# Patient Record
Sex: Male | Born: 1970 | Marital: Married | Smoking: Former smoker
Health system: Southern US, Community
[De-identification: ages and names within clinical notes are randomized; demographics above are authoritative.]

## PROBLEM LIST (undated history)

## (undated) DIAGNOSIS — E7800 Pure hypercholesterolemia, unspecified: Secondary | ICD-10-CM

---

## 2008-08-05 ENCOUNTER — Encounter: Payer: Self-pay | Admitting: General Practice

## 2008-08-12 ENCOUNTER — Encounter: Payer: Self-pay | Admitting: General Practice

## 2008-09-11 ENCOUNTER — Encounter: Payer: Self-pay | Admitting: General Practice

## 2011-02-10 ENCOUNTER — Ambulatory Visit: Payer: Self-pay | Admitting: General Practice

## 2011-09-10 IMAGING — US ABDOMEN ULTRASOUND LIMITED
1 series · 17 of 25 positions shown · non-contrast
Comparison: none

REASON FOR EXAM: RUQ abdominal pain radiating to back
COMMENTS:

[Series 1: abdomen ultrasound limited · 17 of 85 slices shown]
[im 1/85]
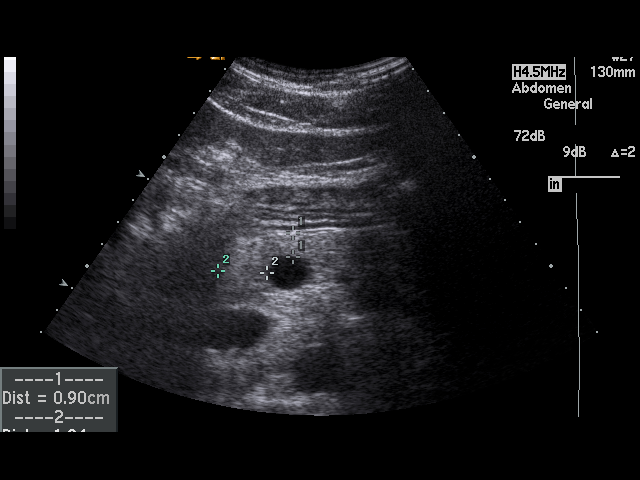
[im 8/85]
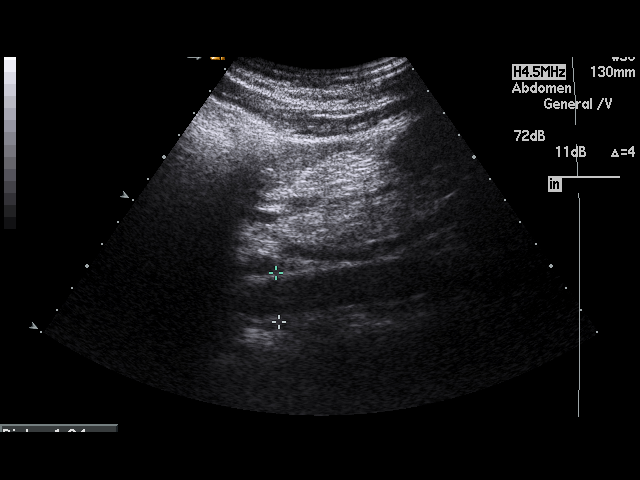
[im 11/85]
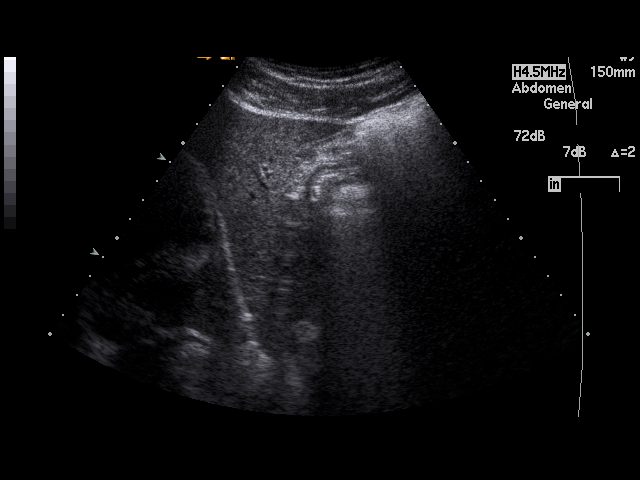
[im 18/85]
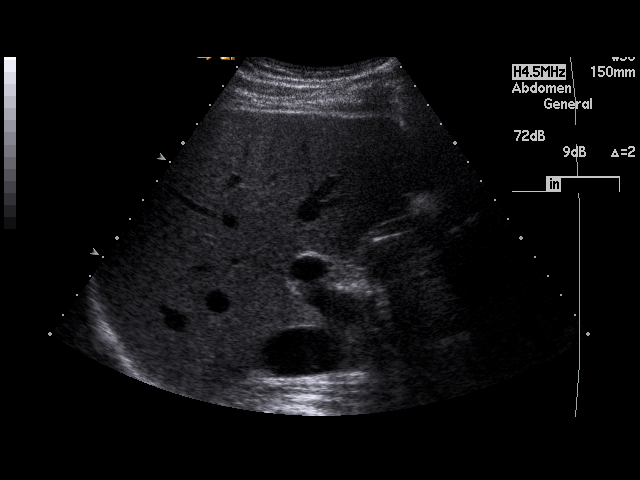
[im 22/85]
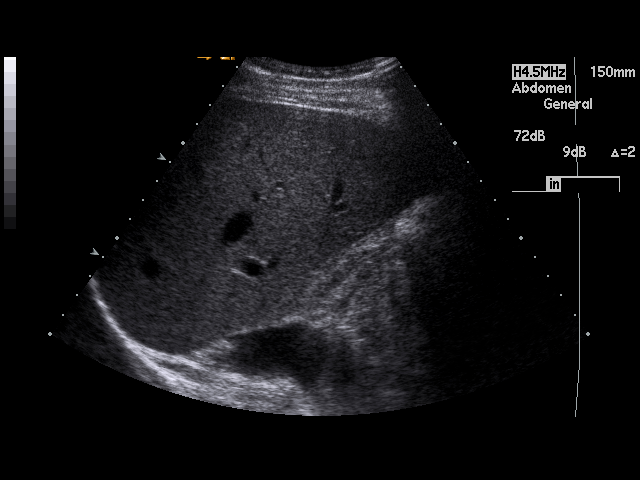
[im 29/85]
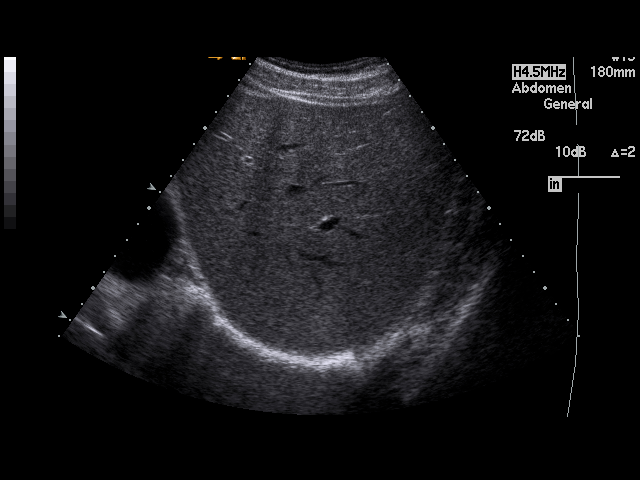
[im 32/85]
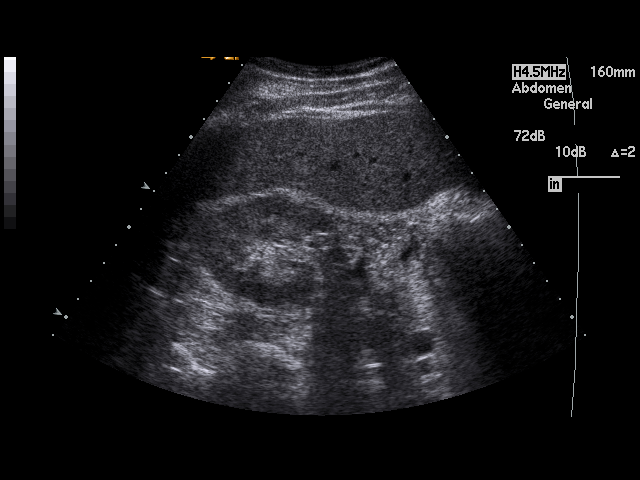
[im 39/85]
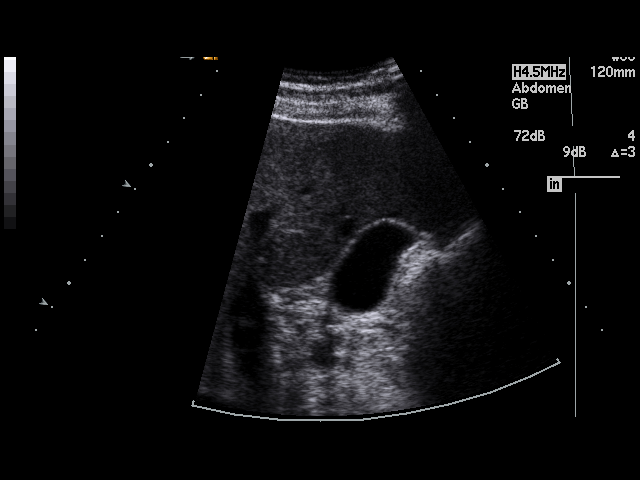
[im 43/85]
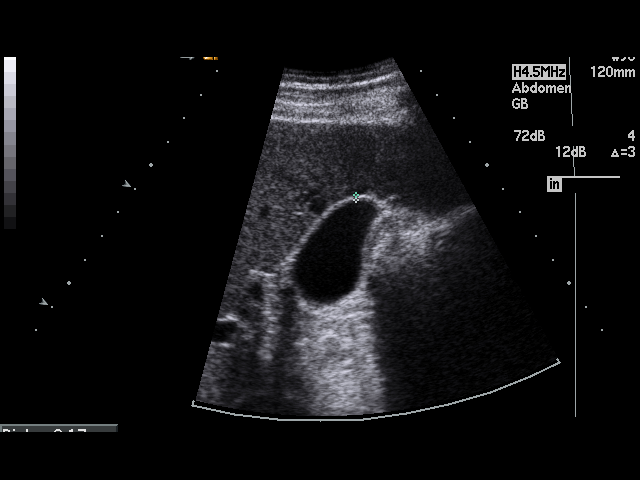
[im 46/85]
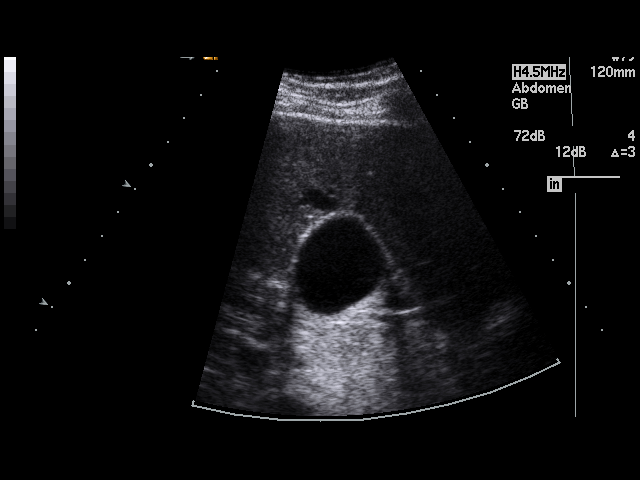
[im 53/85]
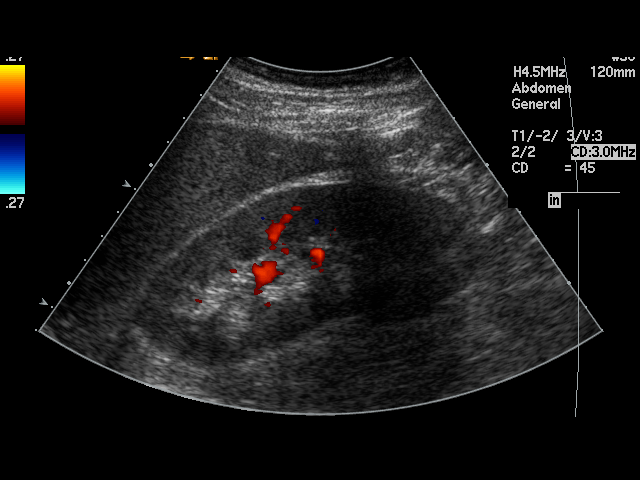
[im 57/85]
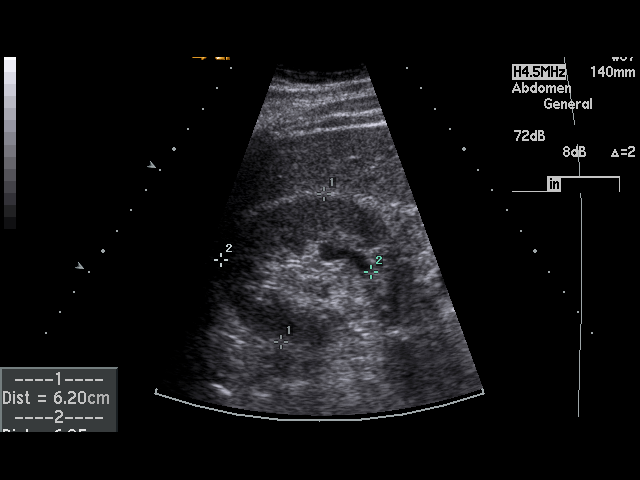
[im 64/85]
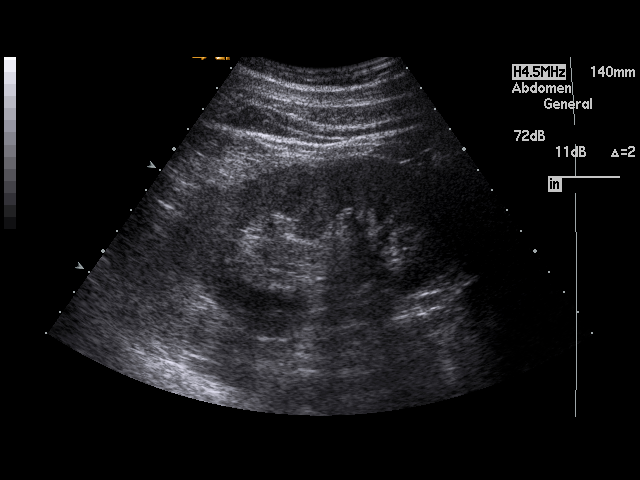
[im 67/85]
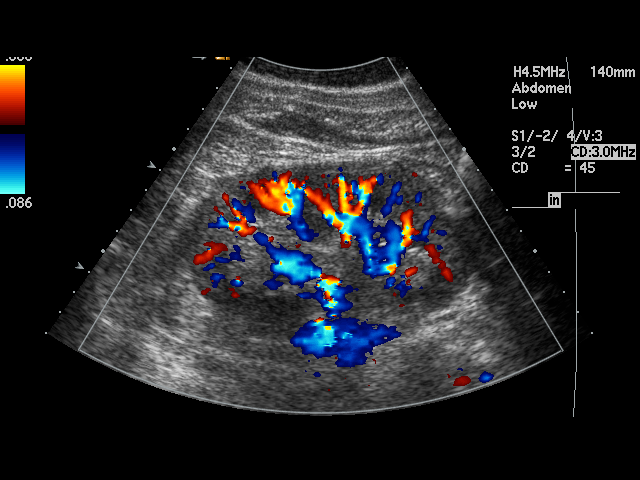
[im 74/85]
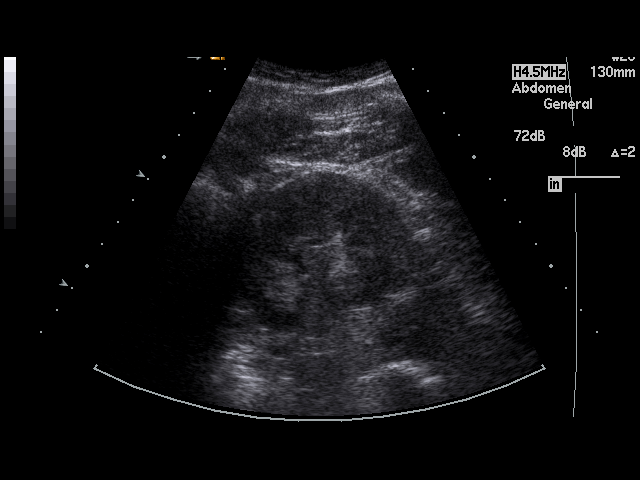
[im 78/85]
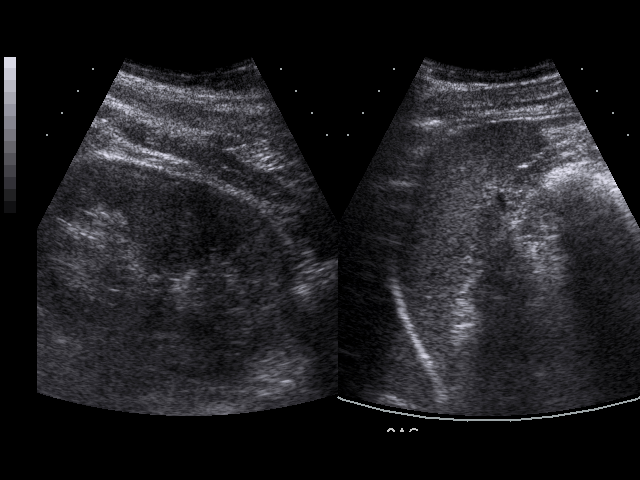
[im 85/85]
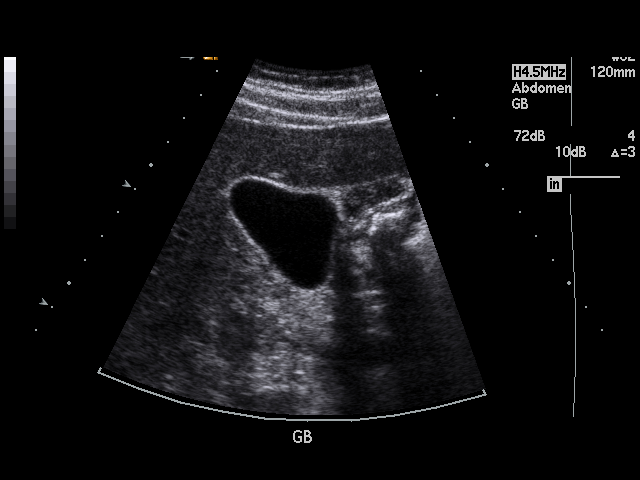

[17 of 25 positions shown; findings below may reference images not displayed]

PROCEDURE:     JOSWEL - JOSWEL ABDOMEN UPPER GENERAL  - February 10, 2011  [DATE]

RESULT:     The liver, spleen, pancreas, abdominal aorta and inferior vena
cava show no significant abnormalities. No gallstones are seen. There is no
thickening of the gallbladder wall. The common bile duct measures 3.5 mm in
diameter which is within normal limits. The kidneys show no hydronephrosis.
Sagittally, the right kidney measures 11.67 cm and the left kidney measures
11.62 cm. No ascites is seen.
IMPRESSION: No significant abnormalities are identified.

## 2015-12-24 ENCOUNTER — Encounter: Payer: Self-pay | Admitting: Physician Assistant

## 2015-12-24 ENCOUNTER — Ambulatory Visit: Payer: Self-pay | Admitting: Physician Assistant

## 2015-12-24 VITALS — BP 120/82 | HR 80 | Temp 98.6°F

## 2015-12-24 DIAGNOSIS — L0291 Cutaneous abscess, unspecified: Secondary | ICD-10-CM

## 2015-12-24 DIAGNOSIS — L0390 Cellulitis, unspecified: Principal | ICD-10-CM

## 2015-12-24 MED ORDER — ERYTHROMYCIN 5 MG/GM OP OINT: 1.0000 "application " | TOPICAL_OINTMENT | Freq: Four times a day (QID) | OPHTHALMIC | Status: DC

## 2015-12-24 MED ORDER — AMOXICILLIN-POT CLAVULANATE 875-125 MG PO TABS: 1.0000 | ORAL_TABLET | Freq: Two times a day (BID) | ORAL | Status: DC

## 2015-12-24 NOTE — Progress Notes (Signed)
S: c/o left upper lid swelling, redness since yesterday, no pain in forehead or other areas of face, no fever/chills  O: vitals wnl, nad, skin on left upper lid with small pustule, lid swollen and red, no drainage, n/v intact  A: cellulitis  P: augmentin 875mg  bid, e-mycin opth ointment

## 2016-03-07 ENCOUNTER — Encounter: Payer: Self-pay | Admitting: Physician Assistant

## 2016-03-07 ENCOUNTER — Ambulatory Visit: Payer: Self-pay | Admitting: Physician Assistant

## 2016-03-07 VITALS — BP 130/80 | HR 76 | Temp 98.4°F

## 2016-03-07 DIAGNOSIS — J069 Acute upper respiratory infection, unspecified: Secondary | ICD-10-CM

## 2016-03-07 DIAGNOSIS — R509 Fever, unspecified: Secondary | ICD-10-CM

## 2016-03-07 MED ORDER — AZITHROMYCIN 250 MG PO TABS: ORAL_TABLET | ORAL | Status: DC

## 2016-03-07 NOTE — Progress Notes (Signed)
S: C/o runny nose and congestion with dry cough for 3 days, + fever, chills, denies cp/sob, v/d; + sore throat, daughter has same sx ; Using otc meds: robitussin  O: PE: vitals wnl, nad,  perrl eomi, normocephalic, tms dull, nasal mucosa red and swollen, throat red, neck supple no lymph, lungs c t a, cv rrr, neuro intact, cough is dry;  flu swab neg  A:  Acute uri  P: zpack,  drink fluids, continue regular meds , use otc meds of choice, return if not improving in 5 days, return earlier if worsening

## 2016-03-09 ENCOUNTER — Telehealth: Payer: Self-pay | Admitting: Physician Assistant

## 2016-03-09 NOTE — Telephone Encounter (Signed)
Contacted patient back Per Darl PikesSusan informed patient that it will take at least 3-4d for medication to take effect. But will give him another excuse note for today.

## 2016-03-09 NOTE — Telephone Encounter (Signed)
Tell him do not want to change his antibiotic yet, will write a work note for him, give the medication more time to work

## 2016-04-05 ENCOUNTER — Other Ambulatory Visit: Payer: Self-pay | Admitting: Physician Assistant

## 2016-06-10 DIAGNOSIS — H5213 Myopia, bilateral: Secondary | ICD-10-CM | POA: Diagnosis not present

## 2016-11-23 ENCOUNTER — Emergency Department: Payer: PRIVATE HEALTH INSURANCE

## 2016-11-23 ENCOUNTER — Emergency Department
Admission: EM | Admit: 2016-11-23 | Discharge: 2016-11-23 | Disposition: A | Discharge status: Home / Self Care | Payer: PRIVATE HEALTH INSURANCE | Attending: Emergency Medicine | Admitting: Emergency Medicine

## 2016-11-23 ENCOUNTER — Encounter: Payer: Self-pay | Admitting: Emergency Medicine

## 2016-11-23 DIAGNOSIS — S39012A Strain of muscle, fascia and tendon of lower back, initial encounter: Secondary | ICD-10-CM | POA: Diagnosis not present

## 2016-11-23 DIAGNOSIS — Y990 Civilian activity done for income or pay: Secondary | ICD-10-CM | POA: Diagnosis not present

## 2016-11-23 DIAGNOSIS — Y929 Unspecified place or not applicable: Secondary | ICD-10-CM | POA: Insufficient documentation

## 2016-11-23 DIAGNOSIS — F17200 Nicotine dependence, unspecified, uncomplicated: Secondary | ICD-10-CM | POA: Diagnosis not present

## 2016-11-23 DIAGNOSIS — R202 Paresthesia of skin: Secondary | ICD-10-CM

## 2016-11-23 DIAGNOSIS — M5136 Other intervertebral disc degeneration, lumbar region: Secondary | ICD-10-CM

## 2016-11-23 DIAGNOSIS — M545 Low back pain, unspecified: Secondary | ICD-10-CM

## 2016-11-23 DIAGNOSIS — M79605 Pain in left leg: Secondary | ICD-10-CM

## 2016-11-23 DIAGNOSIS — X500XXA Overexertion from strenuous movement or load, initial encounter: Secondary | ICD-10-CM | POA: Diagnosis not present

## 2016-11-23 DIAGNOSIS — S3992XA Unspecified injury of lower back, initial encounter: Secondary | ICD-10-CM | POA: Diagnosis present

## 2016-11-23 DIAGNOSIS — N39498 Other specified urinary incontinence: Secondary | ICD-10-CM | POA: Insufficient documentation

## 2016-11-23 DIAGNOSIS — R200 Anesthesia of skin: Secondary | ICD-10-CM

## 2016-11-23 DIAGNOSIS — Y9389 Activity, other specified: Secondary | ICD-10-CM | POA: Diagnosis not present

## 2016-11-23 DIAGNOSIS — R32 Unspecified urinary incontinence: Secondary | ICD-10-CM

## 2016-11-23 DIAGNOSIS — M5126 Other intervertebral disc displacement, lumbar region: Secondary | ICD-10-CM

## 2016-11-23 LAB — BASIC METABOLIC PANEL
Anion gap: 4 — ABNORMAL LOW (ref 5–15)
BUN: 12 mg/dL (ref 6–20)
CO2: 26 mmol/L (ref 22–32)
Calcium: 8.7 mg/dL — ABNORMAL LOW (ref 8.9–10.3)
Chloride: 107 mmol/L (ref 101–111)
Creatinine, Ser: 0.69 mg/dL (ref 0.61–1.24)
GFR calc Af Amer: >60 mL/min (ref >60)
GFR calc non Af Amer: >60 mL/min (ref >60)
Glucose, Bld: 112 mg/dL — ABNORMAL HIGH (ref 65–99)
Potassium: 3.8 mmol/L (ref 3.5–5.1)
Sodium: 137 mmol/L (ref 135–145)

## 2016-11-23 LAB — CBC WITH DIFFERENTIAL/PLATELET
Basophils Absolute: 0.1 10*3/uL (ref 0–0.1)
Basophils Relative: 1 %
Eosinophils Absolute: 0.5 10*3/uL (ref 0–0.7)
Eosinophils Relative: 7 %
HCT: 45.2 % (ref 40.0–52.0)
Hemoglobin: 15.9 g/dL (ref 13.0–18.0)
Lymphocytes Relative: 24 %
Lymphs Abs: 1.9 10*3/uL (ref 1.0–3.6)
MCH: 29.8 pg (ref 26.0–34.0)
MCHC: 35.2 g/dL (ref 32.0–36.0)
MCV: 84.8 fL (ref 80.0–100.0)
Monocytes Absolute: 0.5 10*3/uL (ref 0.2–1.0)
Monocytes Relative: 7 %
Neutro Abs: 4.8 10*3/uL (ref 1.4–6.5)
Neutrophils Relative %: 61 %
Platelets: 167 10*3/uL (ref 150–440)
RBC: 5.33 MIL/uL (ref 4.40–5.90)
RDW: 13.9 % (ref 11.5–14.5)
WBC: 7.7 10*3/uL (ref 3.8–10.6)

## 2016-11-23 MED ORDER — HYDROMORPHONE HCL 1 MG/ML IJ SOLN
INTRAMUSCULAR | Status: AC
  Administered 2016-11-23: 1 mg via INTRAVENOUS
  Filled 2016-11-23: qty 1

## 2016-11-23 MED ORDER — HYDROMORPHONE HCL 1 MG/ML IJ SOLN
1.0000 mg | Freq: Once | INTRAMUSCULAR | Status: AC
  Administered 2016-11-23: 1 mg via INTRAVENOUS

## 2016-11-23 MED ORDER — OXYCODONE-ACETAMINOPHEN 5-325 MG PO TABS: 1.0000 | ORAL_TABLET | Freq: Four times a day (QID) | ORAL | 0 refills | Status: AC | PRN

## 2016-11-23 MED ORDER — CYCLOBENZAPRINE HCL 10 MG PO TABS: 10.0000 mg | ORAL_TABLET | Freq: Three times a day (TID) | ORAL | 0 refills | Status: DC | PRN

## 2016-11-23 MED ORDER — PREDNISONE 10 MG PO TABS: ORAL_TABLET | ORAL | 0 refills | Status: DC

## 2016-11-23 MED ORDER — METHYLPREDNISOLONE SODIUM SUCC 125 MG IJ SOLR
80.0000 mg | Freq: Once | INTRAMUSCULAR | Status: AC
  Administered 2016-11-23: 80 mg via INTRAVENOUS
  Filled 2016-11-23: qty 2

## 2016-11-23 MED ORDER — HYDROMORPHONE HCL 1 MG/ML IJ SOLN
1.0000 mg | Freq: Once | INTRAMUSCULAR | Status: AC
  Administered 2016-11-23: 1 mg via INTRAVENOUS
  Filled 2016-11-23: qty 1

## 2016-11-23 NOTE — Discharge Instructions (Signed)
Please call Dr. Lucienne CapersYarbrough's office to schedule follow up

## 2016-11-23 NOTE — ED Provider Notes (Signed)
[facility] Emergency Department Provider Note  ____________________________________________  Time seen: Approximately 11:25 AM  I have reviewed the triage vital signs and the nursing notes.   HISTORY  Chief Complaint Back Pain    HPI Darin Murphy is a 45 y.o. male , NAD, presents to emergency department via EMS for evaluation of lower back pain. Patient states he was lifting 100 pound boxes while at work earlier today. Had 4 boxes to lift and move. States by the time he got to the third box he felt a pop and had severe pain in his lower back. This caused him to fall forward but denies head injury, neck pain. States that since the injury is had severe lower back pain that is worse on the left. Has numbness and tingling down the left lower extremity. Also notes that he urinated a small amount at the time of the injury but has had control of his bowel and bladder since that time. Denies any numbness or tingling in the saddle region. Has not noted any open wounds or lacerations. Denies any history of back pain or surgeries. Has had no loss of consciousness, lightheadedness or dizziness. Patient does note that his pain is 10 out of 10. Was given fentanyl via IV while being transported by EMS.   History reviewed. No pertinent past medical history.  There are no active problems to display for this patient.   History reviewed. No pertinent surgical history.  Prior to Admission medications   Medication Sig Start Date End Date Taking? Authorizing Provider  cyclobenzaprine (FLEXERIL) 10 MG tablet Take 1 tablet (10 mg total) by mouth 3 (three) times daily as needed for muscle spasms. 11/23/16   Karrah Mangini L Avabella Wailes, PA-C  esomeprazole (NEXIUM) 40 MG capsule TAKE 1 CAPSULE BY MOUTH ONCE DAILY 04/06/16   Faythe GheeSusan W Fisher, PA-C  oxyCODONE-acetaminophen (ROXICET) 5-325 MG tablet Take 1 tablet by mouth every 6 (six) hours as needed. 11/23/16 11/23/17  Kaisyn Reinhold L Savanah Bayles, PA-C  predniSONE  (DELTASONE) 10 MG tablet Take a daily regimen of 6,5,4,3,2,1 11/23/16   Ronny Ruddell L Alston Berrie, PA-C    Allergies Patient has no known allergies.  History reviewed. No pertinent family history.  Social History Social History  Substance Use Topics  . Smoking status: Current Every Day Smoker  . Smokeless tobacco: Never Used  . Alcohol use No     Review of Systems  Constitutional: No fever/chills Eyes: No visual changes.  Cardiovascular: No chest pain. Respiratory: No shortness of breath. No wheezing.  Gastrointestinal: No abdominal pain.  No nausea, vomiting.  No diarrhea.  No constipation. Genitourinary: Negative for dysuria, hematuria. No urinary hesitancy, urgency or increased frequency. Musculoskeletal: Positive for lower back pain. No neck pain. Skin: Negative for rash, Redness, swelling, bruising. Neurological: Positive loss of bladder control during the incident but none since. Positive numbness and tingling left lower extremity. No saddle paresthesias. No head injury, LOC, dizziness, lightheadedness. 10-point ROS otherwise negative.  ____________________________________________   PHYSICAL EXAM:  VITAL SIGNS: ED Triage Vitals  Enc Vitals Group     BP 11/23/16 1116 132/67     Pulse Rate 11/23/16 1116 100     Resp 11/23/16 1116 18     Temp 11/23/16 1116 97.7 F (36.5 C)     Temp Source 11/23/16 1116 Oral     SpO2 11/23/16 1116 96 %     Weight 11/23/16 1117 178 lb (80.7 kg)     Height 11/23/16 1117 5\' 2"  (1.575 m)  Head Circumference --      Peak Flow --      Pain Score 11/23/16 1117 9     Pain Loc --      Pain Edu? --      Excl. in GC? --      Constitutional: Alert and oriented. Well appearing and in no acute distress, but in pain. Eyes: Conjunctivae are normal without icterus, injection or hemorrhage  Head: Atraumatic. Neck: Supple with FROM. Hematological/Lymphatic/Immunilogical: No cervical lymphadenopathy. Cardiovascular: Normal rate, regular rhythm.  Normal S1 and S2.  Good peripheral circulation. Respiratory: Normal respiratory effort without tachypnea or retractions. Lungs CTAB With breath sounds heard in all lung fields. No wheeze, rhonchi, rales. Musculoskeletal: Decreased range of motion of bilateral hips due to lower back pain. Patient is able to passively move bilateral lower extremities but minimally due to pain. Once patient's pain was better controlled, he had better movement of the lower extremities. No lower extremity tenderness nor edema.  No joint effusions. Neurologic:  Normal speech and language. No gross focal neurologic deficits are appreciated. Decreased sensation to light touch as well as sharp about the medial left lower extremity. Skin:  Skin is warm, dry and intact. No rash noted. Psychiatric: Mood and affect are normal. Speech and behavior are normal. Patient exhibits appropriate insight and judgement.   ____________________________________________   LABS (all labs ordered are listed, but only abnormal results are displayed)  Labs Reviewed  BASIC METABOLIC PANEL - Abnormal; Notable for the following:       Result Value   Glucose, Bld 112 (*)    Calcium 8.7 (*)    Anion gap 4 (*)    All other components within normal limits  CBC WITH DIFFERENTIAL/PLATELET   ____________________________________________  EKG  None ____________________________________________  RADIOLOGY I, Ernestene KielJami L Teruko Joswick, personally viewed and evaluated these images (plain radiographs) as part of my medical decision making, as well as reviewing the written report by the radiologist.  Mr Lumbar Spine Wo Contrast  Result Date: 11/23/2016 CLINICAL DATA:  Initial evaluation for low back pain with left leg numbness, loss of bladder control. EXAM: MRI LUMBAR SPINE WITHOUT CONTRAST TECHNIQUE: Multiplanar, multisequence MR imaging of the lumbar spine was performed. No intravenous contrast was administered. COMPARISON:  None available. FINDINGS:  Segmentation: Normal segmentation. Lowest well-formed disc is labeled the L5-S1 level. Alignment: Vertebral bodies are normally aligned with preservation of the normal lumbar lordosis. No listhesis. Vertebrae: Vertebral body heights are well preserved. No evidence for acute or chronic fracture. Signal intensity within the vertebral body bone marrow is normal. No focal osseous lesions. No abnormal marrow edema. Conus medullaris: Extends to the L1 Level and appears normal. Paraspinal and other soft tissues: Paraspinous soft tissues within normal limits. Tiny T2 hyperintense simple cyst noted within the left kidney. Visualized visceral structures are otherwise unremarkable. Disc levels: L1-2:  Negative. L2-3:  Negative. L3-4:  Negative. L4-5: Mild degenerative disc bulging. Superimposed shallow right foraminal disc protrusion, closely approximating the exiting right L4 nerve root without neural impingement (series 5, image 27). Central canal widely patent. No significant foraminal encroachment. L5-S1: Broad posterior disc bulge, eccentric to the left, mildly encroaches upon the left lateral recess (series 5, image 32). Central canal remains widely patent. Minimal foraminal narrowing bilaterally. Protruding disc closely approximates the transiting left S1 nerve root without frank neural impingement. Central canal remains widely patent. Minimal left foraminal narrowing. IMPRESSION: 1. Broad left eccentric disc bulge at L5-S1, closely approximating the transiting left S1 nerve root  without neural impingement or stenosis. 2. Shallow right foraminal disc protrusion at L4-5, closely approximating the transiting right L4 nerve root without impingement or stenosis. 3. Otherwise normal MRI of the lumbar spine. No significant canal stenosis. Electronically Signed   By: Rise Mu M.D.   On: 11/23/2016 13:08    ____________________________________________    PROCEDURES  Procedure(s) performed:  None   Procedures   Medications  HYDROmorphone (DILAUDID) injection 1 mg (1 mg Intravenous Given 11/23/16 1204)  methylPREDNISolone sodium succinate (SOLU-MEDROL) 125 mg/2 mL injection 80 mg (80 mg Intravenous Given 11/23/16 1410)  HYDROmorphone (DILAUDID) injection 1 mg (1 mg Intravenous Given 11/23/16 1410)     ____________________________________________   INITIAL IMPRESSION / ASSESSMENT AND PLAN / ED COURSE  Pertinent labs & imaging results that were available during my care of the patient were reviewed by me and considered in my medical decision making (see chart for details).  Clinical Course     Patient's diagnosis is consistent with Bulging lumbar disks, lower back strain and sciatica of the left side. Patient's MRI showed disc bulge at L4-L5 and L5-S1. The L5-S1 bulge was approximating to the left without neural impingement or stenosis. No impingement or stenosis was noted at L4-L5. Patient was given Dilaudid and Solu-Medrol while in the emergency department and tolerated well with significant decrease in pain. Patient had no further episodes of loss of bladder control and had no episodes of loss of bowel control. Patient continues to deny any numbness or tingling in the saddle region. He has had increased range of motion of the left lower extremity since being given pain medication but still notes some tingling. Patient will be discharged home with prescriptions for Roxicet, prednisone Dosepak and Flexeril to take as directed. Patient is to follow up with Dr. Marcell Barlow in neurosurgery for further evaluation and treatment. Patient is given ED precautions to return to the ED for any worsening or new symptoms.    ____________________________________________  FINAL CLINICAL IMPRESSION(S) / ED DIAGNOSES  Final diagnoses:  Loss of bladder control  Numbness and tingling of left leg  Low back pain radiating to left lower extremity  Bulging lumbar disc  Strain of lumbar region,  initial encounter      NEW MEDICATIONS STARTED DURING THIS VISIT:  Discharge Medication List as of 11/23/2016  1:52 PM    START taking these medications   Details  cyclobenzaprine (FLEXERIL) 10 MG tablet Take 1 tablet (10 mg total) by mouth 3 (three) times daily as needed for muscle spasms., Starting Wed 11/23/2016, Print    oxyCODONE-acetaminophen (ROXICET) 5-325 MG tablet Take 1 tablet by mouth every 6 (six) hours as needed., Starting Wed 11/23/2016, Until Thu 11/23/2017, Print    predniSONE (DELTASONE) 10 MG tablet Take a daily regimen of 6,5,4,3,2,1, Print             Hope Pigeon, PA-C 11/23/16 1522    Jeanmarie Plant, MD 11/23/16 1538

## 2016-11-23 NOTE — ED Triage Notes (Signed)
Pt presents to ED via EMS from work c/o back pain/injury related to work. Picked up heavy boxes and heard something "pop"

## 2016-12-24 ENCOUNTER — Encounter: Payer: Self-pay | Admitting: Emergency Medicine

## 2016-12-24 ENCOUNTER — Emergency Department
Admission: EM | Admit: 2016-12-24 | Discharge: 2016-12-24 | Disposition: A | Discharge status: Home / Self Care | Payer: PRIVATE HEALTH INSURANCE | Attending: Emergency Medicine | Admitting: Emergency Medicine

## 2016-12-24 DIAGNOSIS — X58XXXD Exposure to other specified factors, subsequent encounter: Secondary | ICD-10-CM | POA: Insufficient documentation

## 2016-12-24 DIAGNOSIS — F17200 Nicotine dependence, unspecified, uncomplicated: Secondary | ICD-10-CM | POA: Insufficient documentation

## 2016-12-24 DIAGNOSIS — M545 Low back pain: Secondary | ICD-10-CM | POA: Diagnosis present

## 2016-12-24 DIAGNOSIS — M5136 Other intervertebral disc degeneration, lumbar region: Secondary | ICD-10-CM | POA: Diagnosis not present

## 2016-12-24 DIAGNOSIS — Z79899 Other long term (current) drug therapy: Secondary | ICD-10-CM | POA: Diagnosis not present

## 2016-12-24 DIAGNOSIS — S39012D Strain of muscle, fascia and tendon of lower back, subsequent encounter: Secondary | ICD-10-CM | POA: Diagnosis not present

## 2016-12-24 MED ORDER — KETOROLAC TROMETHAMINE 60 MG/2ML IM SOLN
60.0000 mg | Freq: Once | INTRAMUSCULAR | Status: AC
  Administered 2016-12-24: 60 mg via INTRAMUSCULAR
  Filled 2016-12-24: qty 2

## 2016-12-24 MED ORDER — CYCLOBENZAPRINE HCL 10 MG PO TABS: 10.0000 mg | ORAL_TABLET | Freq: Three times a day (TID) | ORAL | 1 refills | Status: DC | PRN

## 2016-12-24 MED ORDER — HYDROCODONE-ACETAMINOPHEN 5-325 MG PO TABS: 1.0000 | ORAL_TABLET | Freq: Four times a day (QID) | ORAL | 0 refills | Status: AC | PRN

## 2016-12-24 MED ORDER — KETOROLAC TROMETHAMINE 10 MG PO TABS: 10.0000 mg | ORAL_TABLET | Freq: Three times a day (TID) | ORAL | 0 refills | Status: DC

## 2016-12-24 MED ORDER — CYCLOBENZAPRINE HCL 10 MG PO TABS
10.0000 mg | ORAL_TABLET | Freq: Once | ORAL | Status: AC
  Administered 2016-12-24: 10 mg via ORAL
  Filled 2016-12-24: qty 1

## 2016-12-24 MED ORDER — NABUMETONE 750 MG PO TABS: 750.0000 mg | ORAL_TABLET | Freq: Two times a day (BID) | ORAL | 0 refills | Status: DC

## 2016-12-24 NOTE — ED Triage Notes (Signed)
Pt to ED c/o back pain. Per pt family member, pt injured self over a month ago at work and was dx with herniated disc. But back pain became worse last night radiating bilaterally down legs.Pt also reports lost of bowels and bladder. In no acute distress.

## 2016-12-24 NOTE — Discharge Instructions (Signed)
Take the prescription meds as directed. Follow-up with the specialist as scheduled.

## 2016-12-24 NOTE — ED Provider Notes (Signed)
[facility] Emergency Department Provider Note ____________________________________________  Time seen: 1119  I have reviewed the triage vital signs and the nursing notes.  HISTORY  Chief Complaint  Back Pain  HPI Darin Murphy is a 46 y.o. male presents to the ED for evaluation management of ongoing back pain following a work-related injury 1 month ago. Patient was evaluated here in the ED on the date of injury (11/23/16), and evaluated with an MRI which showed some mild disc bulge without nerve impingement at L4-5 and L5-S1. Patient was discharged with prescriptions for pain medicine, muscle relaxant,and steroid. He reports dose of the medications as prescribed in the interim. He does note that the side effect of the oxycodone was heavy sedation and some hallucinations. 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 Patient at this time reports continued low back pain with some referral into the upper hips bilaterally. He denies any distal paresthesias, foot drop, or incontinence of bladder and bowel. He does note some increased frequency in urination, but further notes that that symptom has been persistent prior to his injury. He denies any interim injury, and has not been returned to work since the date of accident.  History reviewed. No pertinent past medical history.  There are no active problems to display for this patient.   History reviewed. No pertinent surgical history.  Prior to Admission medications   Medication Sig Start Date End Date Taking? Authorizing Provider  cyclobenzaprine (FLEXERIL) 10 MG tablet Take 1 tablet (10 mg total) by mouth 3 (three) times daily as needed for muscle spasms. 11/23/16   Jami L Hagler, PA-C  cyclobenzaprine (FLEXERIL) 10 MG tablet Take 1 tablet (10 mg total) by mouth 3 (three) times daily as needed for muscle spasms. 12/24/16   Yedidya Duddy V Bacon Aleksa Collinsworth,  PA-C  esomeprazole (NEXIUM) 40 MG capsule TAKE 1 CAPSULE BY MOUTH ONCE DAILY 04/06/16   Faythe GheeSusan W Fisher, PA-C  HYDROcodone-acetaminophen (NORCO) 5-325 MG tablet Take 1 tablet by mouth every 6 (six) hours as needed. 12/24/16 12/31/16  Sigmund Morera V Bacon Vinessa Macconnell, PA-C  ketorolac (TORADOL) 10 MG tablet Take 1 tablet (10 mg total) by mouth every 8 (eight) hours. 12/24/16   Theta Leaf V Bacon Tytianna Greenley, PA-C  nabumetone (RELAFEN) 750 MG tablet Take 1 tablet (750 mg total) by mouth 2 (two) times daily. 12/24/16   Jhoanna Heyde V Bacon Criag Wicklund, PA-C  oxyCODONE-acetaminophen (ROXICET) 5-325 MG tablet Take 1 tablet by mouth every 6 (six) hours as needed. 11/23/16 11/23/17  Jami L Hagler, PA-C  predniSONE (DELTASONE) 10 MG tablet Take a daily regimen of 6,5,4,3,2,1 11/23/16   Jami L Hagler, PA-C    Allergies Patient has no known allergies.  History reviewed. No pertinent family history.  Social History Social History  Substance Use Topics  . Smoking status: Current Every Day Smoker  . Smokeless tobacco: Never Used  . Alcohol use No    Review of Systems  Constitutional: Negative for fever. Cardiovascular: Negative for chest pain. Respiratory: Negative for shortness of breath. Gastrointestinal: Negative for abdominal pain, vomiting and diarrhea. Genitourinary: Negative for dysuria. Musculoskeletal: Positive for back pain. Skin: Negative for rash. Neurological: Negative for headaches, focal weakness or numbness. Denies incontinence of bladder or bowel.  ____________________________________________  PHYSICAL EXAM:  VITAL SIGNS: ED Triage Vitals  Enc Vitals Group     BP      Pulse      Resp  Temp      Temp src      SpO2      Weight      Height      Head Circumference      Peak Flow      Pain Score      Pain Loc      Pain Edu?      Excl. in GC?     Constitutional: Alert and oriented. Well appearing and in no distress. Head: Normocephalic and atraumatic. Cardiovascular: Normal rate, regular  rhythm. Normal distal pulses. Respiratory: Normal respiratory effort. No wheezes/rales/rhonchi. Gastrointestinal: Soft and nontender. No distention. Musculoskeletal: Normal spinal alignment without midline tenderness, spasm, deformity, or step-off. Patient localizes pain to the bilateral paraspinal musculature of the lumbar sacral junction. He also has some tenderness over the SI joint on the right greater than left. Patient with a slow transition from sit to stand on exam. He said that demonstrated a normal toe and heel raise. He also has normal lumbar flexion to the knees and lumbar extension is noted. Negative Trendelenburg noted. Nontender with normal range of motion in all extremities.  Neurologic: Patient with normal LE DTRs bilaterally. Normal toe dorsiflexion and foot eversion noted bilaterally. Mildly antalgic gait without ataxia. Negative supine and seated straight leg raise bilaterally. Normal speech and language. No gross focal neurologic deficits are appreciated. ____________________________________________   RADIOLOGY  MRI Lumbar Spine (11/23/2016)  IMPRESSION:  1. Broad left eccentric disc bulge at L5-S1, closely approximating the transiting left S1 nerve root without neural impingement or stenosis.  2. Shallow right foraminal disc protrusion at L4-5, closely approximating the transiting right L4 nerve root without impingement or stenosis.  3. Otherwise normal MRI of the lumbar spine. No significant canal stenosis.  Electronically Signed   By: Rise Mu M.D.   On: 11/23/2016 13:08  ____________________________________________  PROCEDURES  Toradol 60 mg IM Flexeril 10 mg PO ____________________________________________  INITIAL IMPRESSION / ASSESSMENT AND PLAN / ED COURSE  Patient with symptoms improved at the time of discharge after IM and by mouth medication administration. He is discharged with prescriptions for Norco 5/325, Flexeril 10 mg, and Toradol 10 mg. He  was also given a prescription for Relafen 750 mg to dose after the completion of his Toradol course. He will follow up with neurosurgery as scheduled in 2 weeks. Patient will follow-up with [place] [facility] for interim medical care. He is released at this time with previous restrictions as set forth by employee health.  Clinical Course    ____________________________________________  FINAL CLINICAL IMPRESSION(S) / ED DIAGNOSES  Final diagnoses:  Strain of lumbar region, subsequent encounter  Degenerative disc disease, lumbar      Lissa Hoard, PA-C 12/24/16 1403    Governor Rooks, MD 12/24/16 [phone]

## 2017-01-05 ENCOUNTER — Ambulatory Visit: Payer: PRIVATE HEALTH INSURANCE | Attending: Neurosurgery

## 2017-01-05 ENCOUNTER — Encounter: Payer: Self-pay | Admitting: Physical Therapy

## 2017-01-05 DIAGNOSIS — M5442 Lumbago with sciatica, left side: Secondary | ICD-10-CM | POA: Insufficient documentation

## 2017-01-05 DIAGNOSIS — R2689 Other abnormalities of gait and mobility: Secondary | ICD-10-CM | POA: Insufficient documentation

## 2017-01-05 NOTE — Patient Instructions (Signed)
Extension: Lumbar - Prone    1. Start by laying on stomach 5 minutes every hour during the daytime, if needed place 1-2 pillows under stomach until you progress to laying flat on stomach.  2. Once you can lay flat on stomach you can progress to propped up on elbows for 5 minutes every hour during the daytime.  3. Final progression would performing press up like the picture above (keep pelvis on the floor and arch lower back). Only perform this once you can tolerate propped on elbows for 5 minutes every hour. Perform 3 sets of 10 press-ups every hour during the day time.  Avoid activities where you are bending forward. Make sure you have good lumbar support when sitting and if needed use a towel roll under low back. Use heat and prescribed medications to help with muscle spasms.

## 2017-01-06 NOTE — Therapy (Signed)
[facility]INE [address] [place], [address] Phone: [phone]   Fax:  [phone]  Physical Therapy Evaluation  Patient Details  Name: Darin Murphy MRN: [id number] Date of Birth: 09/19/1971 Referring Provider: Tressie Stalker  Encounter Date: 01/05/2017      PT End of Session - 01/06/17 0930    Visit Number 1   Number of Visits 16   Date for PT Re-Evaluation 03/03/17   Authorization Type work comp auth 16 visits   PT Start Time 1350   PT Stop Time 1450   PT Time Calculation (min) 60 min   Activity Tolerance Patient limited by pain   Behavior During Therapy [name] for tasks assessed/performed      History reviewed. No pertinent past medical history.  History reviewed. No pertinent surgical history.  There were no vitals filed for this visit.       Subjective Assessment - 01/05/17 1407    Subjective Low back pain   Patient is accompained by: Family member  Initially young male present with patient but she leaves before pt retrieved from waiting room   Pertinent History Quanta Id is a 46 y.o.male who was lifting 100 pound boxes while at work on 11/23/16. He had 4 boxes to lift and move and reports that by the time he got to the third box he felt a pop and had severe pain in his lower back. This caused him to fall forward but he denies head injury, neck pain. States that since the injury is had severe lower back pain that is worse on the left. Has pain that intermittently runs down to the L foot. Also notes that he urinated a small amount at the time of the injury but has had control of his bowel and bladder since that time. He does report increased frequency of urination since the injury and plans to make an appointment with a doctor to discuss. Denies saddle paresthesia. Pt reports he had a similar injury 6-8 years ago but states that it improved with medication. He had a few bouts of physical therapy at  that time but states that it did not help. He denies any history of back surgeries. MRI showed broad left eccentric disc bulge at L5-S1, closely approximating the transiting left S1 nerve root without neural impingement or stenosis and shallow right foraminal disc protrusion at L4-5, closely approximating the transiting right L4 nerve root without impingement or stenosis. Pt saw neurosurgery who recommended PT as well as spinal injection. Pt has an appointment for the injection scheduled toward the end of February but he cannot remember the exact date. Of note there appears to be somewhat of a language barrier present, as pt does not appear to understand all of the questions that are asked by therapist. ROS negative for red flags   How long can you sit comfortably? 15 minutes   How long can you stand comfortably? 1 hr   How long can you walk comfortably? 5 minutes   Diagnostic tests MRI: broad left eccentric disc bulge at L5-S1, closely approximating the transiting left S1 nerve root without neural impingement or stenosis and shallow right foraminal disc protrusion at L4-5, closely approximating the transiting right L4 nerve root without impingement or stenosis   Patient Stated Goals Decrease pain   Currently in Pain? Yes   Pain Score 8   Best: 4/10; Worst: 9/10   Pain Location Back   Pain Orientation Lower  Central low  back   Pain Descriptors / Indicators Sharp   Pain Type Acute pain   Pain Radiating Towards Posterior LLE down to L foot   Pain Onset More than a month ago   Pain Frequency Constant   Aggravating Factors  wakes him out of his sleep, pain is worse at night when laying down, remaining in one position for too long, forward bending, sitting at a computer   Pain Relieving Factors Medications but otherwise unable to describe. states he has not tried ice/heat.    Multiple Pain Sites No            [facility] PT Assessment - 01/05/17 1413      Assessment   Medical Diagnosis Strain of  lumbar region   Referring Provider Tressie Stalker   Onset Date/Surgical Date 11/23/16   Hand Dominance Right   Next MD Visit Late February with neurosurgeon   Prior Therapy Yes when he had injury 6-8 years ago. States that it did not help     Precautions   Precautions None     Restrictions   Weight Bearing Restrictions No     Balance Screen   Has the patient fallen in the past 6 months Yes   How many times? 1   Has the patient had a decrease in activity level because of a fear of falling?  Yes   Is the patient reluctant to leave their home because of a fear of falling?  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 Otherwise intact sensation to light touch in bilateral LEs L2-S2. Denies saddle paresthesia     Posture/Postural Control   Posture Comments Pt leaning forward secondary to pain. Difficult to assess at this time      ROM / Strength   AROM / PROM / Strength AROM;Strength     AROM   Overall AROM Comments Pt reports pain with lumbar flexion, extension, rotation, and lateral flexion. Unable to report any peripheralization or centralization of symptoms with attempt at repeated movements. Does not tolerate well. Attempted CPA to lumbar spine but pt will not tolerate. Attempted passive hip flexion but pt does not tolerate. HIp IR/ER appears grossly WFL without reproduction of pain. Slump negative. SLR positive bilaterally 45 degrees RLE and 55 degrees LLE. Attempted prone positioning with patient but he  doesn't tolerate. Placed 1 pillow under hips and appears to make slightly better. Attempted to place moist hot pack on low back but pt only tolerates for approximatel 30 seconds before reporting that the weight of the hotpack increases his pain too much.     Strength   Overall Strength Comments Pt gives poor effort during strength testing. Reports increase in low back pain with all attempts at manual muscles testing of bilateral LEs including ankle dorsiflexion. Pain response appears to be somewhat exaggerated. No clonus or changes in tone noted. Pt unable to perform hooklying bridges secondary to pain     Palpation  Palpation comment Pt only tolerates very light palpation of lumbar paraspinals. No overt spasms noted but palpation is extremely limited     Transfers   Comments Independent with transfers without UE support     Ambulation/Gait   Gait Comments Gait is antalgic with forward flexed trunk           Attempted prone positioning with pillow under hips but pt only able to tolerate for 3-4 minutes due to pain. Attempted moist heat pack on back but pt reports it is painful due to pressure. Pt educated in HEP which including prone positioning progression.                 PT Education - 01/06/17 0929    Education provided Yes   Education Details HEP, plan of care   Person(s) Educated Patient   Methods Explanation   Comprehension Verbalized understanding             PT Long Term Goals - 01/06/17 1030      PT LONG TERM GOAL #1   Title Pt will be independent with HEP in order to improve strength and balance in order to decrease fall risk and improve function at home and work.    Time 8   Period Weeks   Status New     PT LONG TERM GOAL #2   Title  Pt will decrease mODI scoreby at least 13 points in order demonstrate clinically significant reduction in pain/disability    Baseline 01/05/17: 78%   Time 8   Status New     PT LONG TERM GOAL #3   Title Pt will  decrease low back pain to at worst 7/10 on NPRS in order to demonstrate clinically significant reduction in back pain   Baseline 01/05/17: 9/10   Time 8   Period Weeks   Status New     PT LONG TERM GOAL #4   Title Pt will demonstrate proper lifting mechanics from floor to overhead in order to prevent reccurrence of low back injury   Time 8   Status New               Plan - 01/06/17 1020    Clinical Impression Statement Pt is a pleasant 46 yo male referred for acute low back pain secondary to work related injury. PT examination is limited today as pt cannot tolerate much activity due to high level of pain. He gives poor effort with LE strength testing reporting back pain with all resistance including seated ankle dorsiflexion. Negative slump but positive SLR bilaterally. Pt reports decreased sensation to L4 and S1 dermatomes on LLE. Pt cannot tolerate spinal mobility testing or palpation due to pain. Modified ODI is 78% indicating high level of self-reported disability. Attempted pain-relieving measures but pt reports difficulty remaining in any one position for much time. Unable to tolerate heat pack to lumbar spine in prone with pillow under hips. Pt does not believe that physical therapy will help his pain and is not interested in returning. Therapist encouraged pt to follow-up as this is a work-related injury and any further intervention needs authorization from work-comp carrier. Pt will benefit from skilled PT services to decrease pain and facilitate return to prior level of function at work.    Rehab Potential Fair   Clinical Impairments Affecting Rehab Potential Positive: age; Negative: does not believe physical therapy will help, does not appear motivated to participate   PT Frequency 2x / week   PT Duration 8  weeks   PT Treatment/Interventions Aquatic Therapy;Cryotherapy;Electrical Stimulation;Iontophoresis 4mg /ml Dexamethasone;Moist Heat;Traction;Ultrasound;DME Instruction;Gait  training;Stair training;Functional mobility training;Therapeutic activities;Therapeutic exercise;Balance training;Neuromuscular re-education;Patient/family education;Manual techniques;Passive range of motion;Dry needling   PT Next Visit Plan Progress with pain modalities until he is able to progress to more aggressive manual therapy and ther-ex   PT Home Exercise Plan Only thing pt will tolerate is laying on stomach for brief bout with pillow under hips. Encouraged him to perform 5 minutes every hour. Progress to flat on stomach, propped on elbows, and finally prone press-up   Consulted and Agree with Plan of Care Patient      Patient will benefit from skilled therapeutic intervention in order to improve the following deficits and impairments:  Abnormal gait, Decreased range of motion, Decreased strength, Pain  Visit Diagnosis: Acute midline low back pain with left-sided sciatica - Plan: PT plan of care cert/re-cert  Other abnormalities of gait and mobility - Plan: PT plan of care cert/re-cert     Problem List There are no active problems to display for this patient.  Lynnea MaizesJason D Huprich PT, DPT   Huprich,Jason 01/06/2017, 10:35 AM  [facility] [address] [place], [place], [address] Phone: [phone]   Fax:  [phone]  Name: Darin ButtersRidvan Murphy MRN: [id number] Date of Birth: 03/15/1971

## 2017-01-09 ENCOUNTER — Ambulatory Visit: Payer: PRIVATE HEALTH INSURANCE | Admitting: Physical Therapy

## 2017-01-09 ENCOUNTER — Encounter: Payer: Self-pay | Admitting: Physical Therapy

## 2017-01-09 DIAGNOSIS — M5442 Lumbago with sciatica, left side: Secondary | ICD-10-CM

## 2017-01-09 DIAGNOSIS — R2689 Other abnormalities of gait and mobility: Secondary | ICD-10-CM

## 2017-01-09 NOTE — Therapy (Signed)
[facility] [address] [place], [place], [address] Phone: [phone]   Fax:  [phone]  Physical Therapy Treatment  Patient Details  Name: Darin Murphy MRN: [id number] Date of Birth: 12/15/1970 Referring Provider: Tressie StalkerJenkins, Jeffrey  Encounter Date: 01/09/2017      PT End of Session - 01/09/17 0953    Visit Number 2   Number of Visits 16   Date for PT Re-Evaluation 03/03/17   Authorization Type work comp auth 16 visits   PT Start Time [phone]   PT Stop Time 1027   PT Time Calculation (min) 36 min   Activity Tolerance Patient limited by pain   Behavior [facility] for tasks assessed/performed      History reviewed. No pertinent past medical history.  History reviewed. No pertinent surgical history.  There were no vitals filed for this visit.      Subjective Assessment - 01/09/17 0955    Subjective Pt reports that placing a pillow under his hips if he decide to lie on his stomach helped relieve his pain some but mainly taking his prescribed medicine has made the difference.  Pt presents with very guarded posture and 8/10 pain in L lower back.  Pt reports his pain is about the same as last session.  Continues to deny any bowel or bladder changes.  Pt is planning to try to find another doctor for a second opinion.    Patient is accompained by: Family member  Initially young male present with patient but she leaves before pt retrieved from waiting room   Pertinent History Darin Murphy is a [age] y.o.male who was lifting 100 pound boxes while at work on 11/23/16. He had 4 boxes to lift and move and reports that by the time he got to the third box he felt a pop and had severe pain in his lower back. This caused him to fall forward but he denies head injury, neck pain. States that since the injury is had severe lower back pain that is worse on the left. Has pain that intermittently runs down to the L foot. Also notes that  he urinated a small amount at the time of the injury but has had control of his bowel and bladder since that time. He does report increased frequency of urination since the injury and plans to make an appointment with a doctor to discuss. Denies saddle paresthesia. Pt reports he had a similar injury 6-8 years ago but states that it improved with medication. He had a few bouts of physical therapy at that time but states that it did not help. He denies any history of back surgeries. MRI showed broad left eccentric disc bulge at L5-S1, closely approximating the transiting left S1 nerve root without neural impingement or stenosis and shallow right foraminal disc protrusion at L4-5, closely approximating the transiting right L4 nerve root without impingement or stenosis. Pt saw neurosurgery who recommended PT as well as spinal injection. Pt has an appointment for the injection scheduled toward the end of February but he cannot remember the exact date. Of note there appears to be somewhat of a language barrier present, as pt does not appear to understand all of the questions that are asked by therapist. ROS negative for red flags   How long can you sit comfortably? 15 minutes   How long can you stand comfortably? 1 hr   How long can you walk comfortably? 5 minutes   Diagnostic tests  MRI: broad left eccentric disc bulge at L5-S1, closely approximating the transiting left S1 nerve root without neural impingement or stenosis and shallow right foraminal disc protrusion at L4-5, closely approximating the transiting right L4 nerve root without impingement or stenosis   Patient Stated Goals Decrease pain   Currently in Pain? Yes   Pain Score 8    Pain Location Back   Pain Orientation Left;Lower   Pain Descriptors / Indicators Sharp   Pain Onset More than a month ago         TREATMENT   Manual Therapy:  Very light STM to Bil lumbar paraspinals x10 mins, severe muscular tension appreciated in these regions  L>R. One break after 5 minutes.   Therapeutic Exercise:  Attempted posterior pelvic tilts for TA activation in sitting as this was pt's position of comfort; however, pt unable to properly perform despite max multimodal cues.  TA contractions in hooklying with verbal and tactile cues. Very light activation appreciated. Pt required cues to breathe as he has tendency to hold his breath. Incorporated expirations and inspirations with muscle activation and relaxation. 2x10  Hooklying lumbar rotations in very limited range x10 each direction  Therapeutic Activity following all other treatment:  Log roll technique with min guard assist for first attempt supine>sit. And pt then performed sit>supine and supine>sit with verbal cues only.  Discontinued session at this point as pt reports his pain had remained the same but pt unable to tolerate any additional intervention.               PT Education - 01/09/17 0953    Education provided Yes   Education Details Exercise technique; reasoning for STM; bed mobility   Person(s) Educated Patient   Methods Explanation;Demonstration;Verbal cues;Tactile cues   Comprehension Verbalized understanding;Returned demonstration;Tactile cues required;Verbal cues required;Need further instruction             PT Long Term Goals - 01/06/17 1030      PT LONG TERM GOAL #1   Title Pt will be independent with HEP in order to improve strength and balance in order to decrease fall risk and improve function at home and work.    Time 8   Period Weeks   Status New     PT LONG TERM GOAL #2   Title  Pt will decrease mODI scoreby at least 13 points in order demonstrate clinically significant reduction in pain/disability    Baseline 01/05/17: 78%   Time 8   Status New     PT LONG TERM GOAL #3   Title Pt will decrease low back pain to at worst 7/10 on NPRS in order to demonstrate clinically significant reduction in back pain   Baseline 01/05/17: 9/10   Time 8    Period Weeks   Status New     PT LONG TERM GOAL #4   Title Pt will demonstrate proper lifting mechanics from floor to overhead in order to prevent reccurrence of low back injury   Time 8   Status New               Plan - 01/09/17 1027    Clinical Impression Statement Pt continues to present with a very guarded posture and severe trigger points in Bil lumbar paraspinals (L>R) and pt tolerated only very light STM to this region.  Was unable to attempt hot pack as pt declines and says this was too heavy for him at last session and he would not like to try  this.  He will go home after session and use his personal heating pad.  Pt demonstrated poor activation of TA and required max verbal and tactile cues to achieve minimal contraction in an effort to relax lumbar musculature and promote improved posture and trunk control. Pt educated in log roll technique to decrease pain and irritation to lower back with bed mobility, pt encouraged to continue with this technique at home.  Pt will benefit from continued skilled PT interventions for improved QOL, decreased pain, and improved functional mobility.   Rehab Potential Fair   Clinical Impairments Affecting Rehab Potential Positive: age; Negative: does not believe physical therapy will help, does not appear motivated to participate   PT Frequency 2x / week   PT Duration 8 weeks   PT Treatment/Interventions Aquatic Therapy;Cryotherapy;Electrical Stimulation;Iontophoresis 4mg /ml Dexamethasone;Moist Heat;Traction;Ultrasound;DME Instruction;Gait training;Stair training;Functional mobility training;Therapeutic activities;Therapeutic exercise;Balance training;Neuromuscular re-education;Patient/family education;Manual techniques;Passive range of motion;Dry needling   PT Next Visit Plan Progress with pain modalities until he is able to progress to more aggressive manual therapy and ther-ex   PT Home Exercise Plan Only thing pt will tolerate is laying on  stomach for brief bout with pillow under hips. Encouraged him to perform 5 minutes every hour. Progress to flat on stomach, propped on elbows, and finally prone press-up   Consulted and Agree with Plan of Care Patient      Patient will benefit from skilled therapeutic intervention in order to improve the following deficits and impairments:  Abnormal gait, Decreased range of motion, Decreased strength, Pain  Visit Diagnosis: Acute midline low back pain with left-sided sciatica  Other abnormalities of gait and mobility     Problem List There are no active problems to display for this patient.   Encarnacion Chu PT, DPT 01/09/2017, 10:35 AM  [facility] SPORTS MEDICINE [address], [place], [address] Phone: [phone]   Fax:  [phone]  Name: Darin Murphy MRN: [id number] Date of Birth: [birth year]

## 2017-01-11 ENCOUNTER — Encounter: Payer: PRIVATE HEALTH INSURANCE | Admitting: Physical Therapy

## 2017-01-12 ENCOUNTER — Encounter: Payer: PRIVATE HEALTH INSURANCE | Admitting: Physical Therapy

## 2017-01-12 ENCOUNTER — Ambulatory Visit: Payer: PRIVATE HEALTH INSURANCE | Attending: Neurosurgery | Admitting: Physical Therapy

## 2017-01-12 DIAGNOSIS — R2689 Other abnormalities of gait and mobility: Secondary | ICD-10-CM | POA: Insufficient documentation

## 2017-01-12 DIAGNOSIS — M5442 Lumbago with sciatica, left side: Secondary | ICD-10-CM | POA: Diagnosis not present

## 2017-01-12 NOTE — Therapy (Signed)
[facility] PHYSICAL AND SPORTS MEDICINE [address] [place], [address] Phone: [phone]   Fax:  [phone]  Physical Therapy Treatment  Patient Details  Name: Darin Murphy MRN: [id number] Date of Birth: 05/28/1971 Referring Provider: Tressie Stalker  Encounter Date: 01/12/2017      PT End of Session - 01/12/17 1521    Visit Number 3   Number of Visits 16   Date for PT Re-Evaluation 03/03/17   Authorization Type work comp auth 16 visits   PT Start Time 1434   PT Stop Time 1514   PT Time Calculation (min) 40 min   Activity Tolerance Patient limited by pain   Behavior During Therapy Flat affect  Depressed      No past medical history on file.  No past surgical history on file.  There were no vitals filed for this visit.      Subjective Assessment - 01/12/17 1444    Subjective Patient reports he has had low back pain constantly from December lifting injury on, reports he has found no relief and pain is constant. He reports he needs to get a refill on his medicine, and is terrified he has irreperable damage to his back. Reports he had a similar episode 6 years ago which never really improved.    Patient is accompained by: Family member  Initially young male present with patient but she leaves before pt retrieved from waiting room   Pertinent History Irl Id is a 46 y.o.male who was lifting 100 pound boxes while at work on 11/23/16. He had 4 boxes to lift and move and reports that by the time he got to the third box he felt a pop and had severe pain in his lower back. This caused him to fall forward but he denies head injury, neck pain. States that since the injury is had severe lower back pain that is worse on the left. Has pain that intermittently runs down to the L foot. Also notes that he urinated a small amount at the time of the injury but has had control of his bowel and bladder since that time. He does report increased  frequency of urination since the injury and plans to make an appointment with a doctor to discuss. Denies saddle paresthesia. Pt reports he had a similar injury 6-8 years ago but states that it improved with medication. He had a few bouts of physical therapy at that time but states that it did not help. He denies any history of back surgeries. MRI showed broad left eccentric disc bulge at L5-S1, closely approximating the transiting left S1 nerve root without neural impingement or stenosis and shallow right foraminal disc protrusion at L4-5, closely approximating the transiting right L4 nerve root without impingement or stenosis. Pt saw neurosurgery who recommended PT as well as spinal injection. Pt has an appointment for the injection scheduled toward the end of February but he cannot remember the exact date. Of note there appears to be somewhat of a language barrier present, as pt does not appear to understand all of the questions that are asked by therapist. ROS negative for red flags   How long can you sit comfortably? 15 minutes   How long can you stand comfortably? 1 hr   How long can you walk comfortably? 5 minutes   Diagnostic tests MRI: broad left eccentric disc bulge at L5-S1, closely approximating the transiting left S1 nerve root without neural impingement or stenosis and shallow right  foraminal disc protrusion at L4-5, closely approximating the transiting right L4 nerve root without impingement or stenosis   Patient Stated Goals Decrease pain   Currently in Pain? Yes   Pain Score --  He does not rate but leads on to pain being moderate to severe.    Pain Location Back   Pain Orientation Left;Lower   Pain Descriptors / Indicators Sharp   Pain Type Acute pain   Pain Onset More than a month ago   Pain Frequency Constant      Soft tissue mobilization to L gluteal/flank -- multiple bouts provided patient reports feeling lessening pain after each bout but does not provide any rating of  improvement. Of note he does ambulate with improved gait speed upon leaving clinic with increased stride length noted. Patient very difficult to interact with throughout session, PT asked repeatedly about patient's insights into his back pain, and how it could be improved. He continued to state "go call my doctor" and "probably the medicine".   Attempted extension in standing, trunk extension over bolster against wall which patient reported as not improving symptoms substantially.  Gait assessment - slow lethargic pattern with minimal floor clearance and minimal stride length.                           PT Education - 01/12/17 1520    Education provided Yes   Education Details That back pain largely resolves on it's own, MRI does not dictate pain source, therapy has been helpful for people in his scenario.    Person(s) Educated Patient   Methods Explanation   Comprehension Verbalized understanding             PT Long Term Goals - 01/06/17 1030      PT LONG TERM GOAL #1   Title Pt will be independent with HEP in order to improve strength and balance in order to decrease fall risk and improve function at home and work.    Time 8   Period Weeks   Status New     PT LONG TERM GOAL #2   Title  Pt will decrease mODI scoreby at least 13 points in order demonstrate clinically significant reduction in pain/disability    Baseline 01/05/17: 78%   Time 8   Status New     PT LONG TERM GOAL #3   Title Pt will decrease low back pain to at worst 7/10 on NPRS in order to demonstrate clinically significant reduction in back pain   Baseline 01/05/17: 9/10   Time 8   Period Weeks   Status New     PT LONG TERM GOAL #4   Title Pt will demonstrate proper lifting mechanics from floor to overhead in order to prevent reccurrence of low back injury   Time 8   Status New               Plan - 01/12/17 1521    Clinical Impression Statement Patient refuses to answer PT  questioning what he thinks is "going on with his back". He replies with "[place] in [place]". He reports he believes his symptoms can only be treated with medicine, demonstrates minimal step lengths, obvious signs of pain throughout session. He does report decreased pain with soft tissue mobilization. Unable to tolerate any extension based activities in this session. He seems quite down and withdrawn throughout session and does not provide much more than short answers throughout, limiting the  effectiveness of this session.    Rehab Potential Fair   Clinical Impairments Affecting Rehab Potential Positive: age; Negative: does not believe physical therapy will help, does not appear motivated to participate   PT Frequency 2x / week   PT Duration 8 weeks   PT Treatment/Interventions Aquatic Therapy;Cryotherapy;Electrical Stimulation;Iontophoresis 4mg /ml Dexamethasone;Moist Heat;Traction;Ultrasound;DME Instruction;Gait training;Stair training;Functional mobility training;Therapeutic activities;Therapeutic exercise;Balance training;Neuromuscular re-education;Patient/family education;Manual techniques;Passive range of motion;Dry needling   PT Next Visit Plan Progress with pain modalities until he is able to progress to more aggressive manual therapy and ther-ex   PT Home Exercise Plan Only thing pt will tolerate is laying on stomach for brief bout with pillow under hips. Encouraged him to perform 5 minutes every hour. Progress to flat on stomach, propped on elbows, and finally prone press-up   Consulted and Agree with Plan of Care Patient      Patient will benefit from skilled therapeutic intervention in order to improve the following deficits and impairments:  Abnormal gait, Decreased range of motion, Decreased strength, Pain  Visit Diagnosis: Acute midline low back pain with left-sided sciatica  Other abnormalities of gait and mobility     Problem List There are no active problems to  display for this patient.   Kerin Ransom, PT, DPT    01/12/2017, 3:26 PM  [facility] [place] PHYSICAL AND SPORTS MEDICINE [address] [place], [address] Phone: [phone]   Fax:  [phone]  Name: Darin Murphy MRN: [id number] Date of Birth: 1971/06/15

## 2017-01-17 ENCOUNTER — Encounter: Payer: PRIVATE HEALTH INSURANCE | Admitting: Physical Therapy

## 2017-01-17 ENCOUNTER — Ambulatory Visit: Payer: PRIVATE HEALTH INSURANCE | Admitting: Physical Therapy

## 2017-01-17 DIAGNOSIS — M5442 Lumbago with sciatica, left side: Secondary | ICD-10-CM | POA: Diagnosis not present

## 2017-01-17 DIAGNOSIS — R2689 Other abnormalities of gait and mobility: Secondary | ICD-10-CM

## 2017-01-17 NOTE — Patient Instructions (Signed)
Hot pack, electric stimulation applied while sitting with pillow provided for lumbar support (patient reports he is having too much pain while sitting, must change positions). X 10 minutes at 14 mA  Attempted supine bridging -- patient able to complete x 2, begins to wince in pain, reports he pushed too far, begins to ambulate while holding his lumbar spine due to pain.   Attempted standing fall outs in doorway through controlled ROM, patient reports this was too painful to continue after 3 repetitions.   Attempted prone laying with pillow beneath belly, reports this position is better but still too painful to continue. Reports his only position of relative comfort is standing.

## 2017-01-17 NOTE — Therapy (Signed)
[facility] PHYSICAL AND SPORTS MEDICINE [address] [place], [place], [id number] Phone: [phone]   Fax:  [phone]  Physical Therapy Treatment  Patient Details  Name: Darin ButtersRidvan Murphy MRN: [id number] Date of Birth: 12/01/1971 Referring Provider: Tressie StalkerJenkins, Jeffrey  Encounter Date: 01/17/2017      PT End of Session - 01/17/17 1406    Visit Number 4   Number of Visits 16   Date for PT Re-Evaluation 03/03/17   Authorization Type work comp auth 16 visits   PT Start Time 1350   PT Stop Time 1420   PT Time Calculation (min) 30 min   Activity Tolerance Patient limited by pain   Behavior During Therapy Flat affect  Depressed      No past medical history on file.  No past surgical history on file.  There were no vitals filed for this visit.      Subjective Assessment - 01/17/17 1351    Subjective Patient reports he has had an up and down weekend. Reports the pain medication makes his back feel better, he felt somewhat worse after last session. Reports he had more difficulty sleeping due to increased pain after soft tissue mobilization.    Patient is accompained by: Family member  Initially young male present with patient but she leaves before pt retrieved from waiting room   Pertinent History [name] is a 46 y.o.male who was lifting 100 pound boxes while at work on 11/23/16. He had 4 boxes to lift and move and reports that by the time he got to the third box he felt a pop and had severe pain in his lower back. This caused him to fall forward but he denies head injury, neck pain. States that since the injury is had severe lower back pain that is worse on the left. Has pain that intermittently runs down to the L foot. Also notes that he urinated a small amount at the time of the injury but has had control of his bowel and bladder since that time. He does report increased frequency of urination since the injury and plans to make an appointment with a  doctor to discuss. Denies saddle paresthesia. Pt reports he had a similar injury 6-8 years ago but states that it improved with medication. He had a few bouts of physical therapy at that time but states that it did not help. He denies any history of back surgeries. MRI showed broad left eccentric disc bulge at L5-S1, closely approximating the transiting left S1 nerve root without neural impingement or stenosis and shallow right foraminal disc protrusion at L4-5, closely approximating the transiting right L4 nerve root without impingement or stenosis. Pt saw neurosurgery who recommended PT as well as spinal injection. Pt has an appointment for the injection scheduled toward the end of February but he cannot remember the exact date. Of note there appears to be somewhat of a language barrier present, as pt does not appear to understand all of the questions that are asked by therapist. ROS negative for red flags   How long can you sit comfortably? 15 minutes   How long can you stand comfortably? 1 hr   How long can you walk comfortably? 5 minutes   Diagnostic tests MRI: broad left eccentric disc bulge at L5-S1, closely approximating the transiting left S1 nerve root without neural impingement or stenosis and shallow right foraminal disc protrusion at L4-5, closely approximating the transiting right L4 nerve root without impingement or stenosis  Patient Stated Goals Decrease pain   Currently in Pain? Yes   Pain Location Back   Pain Orientation Left;Right;Lower   Pain Descriptors / Indicators Aching   Pain Onset More than a month ago      Hot pack, electric stimulation applied while sitting with pillow provided for lumbar support (patient reports he is having too much pain while sitting, must change positions). X 10 minutes at 14 mA  Attempted supine bridging -- patient able to complete x 2, begins to wince in pain, reports he pushed too far, begins to ambulate while holding his lumbar spine due to pain.    Attempted standing fall outs in doorway through controlled ROM, patient reports this was too painful to continue after 3 repetitions.   Attempted prone laying with pillow beneath belly, reports this position is better but still too painful to continue. Reports his only position of relative comfort is standing                           PT Education - 01/17/17 1406    Education provided Yes   Education Details Flexion and extension patterns and how they relate to pain felt in lumbar spine currently.    Person(s) Educated Patient   Methods Explanation;Verbal cues   Comprehension Verbalized understanding             PT Long Term Goals - 01/06/17 1030      PT LONG TERM GOAL #1   Title Pt will be independent with HEP in order to improve strength and balance in order to decrease fall risk and improve function at home and work.    Time 8   Period Weeks   Status New     PT LONG TERM GOAL #2   Title  Pt will decrease mODI scoreby at least 13 points in order demonstrate clinically significant reduction in pain/disability    Baseline 01/05/17: 78%   Time 8   Status New     PT LONG TERM GOAL #3   Title Pt will decrease low back pain to at worst 7/10 on NPRS in order to demonstrate clinically significant reduction in back pain   Baseline 01/05/17: 9/10   Time 8   Period Weeks   Status New     PT LONG TERM GOAL #4   Title Pt will demonstrate proper lifting mechanics from floor to overhead in order to prevent reccurrence of low back injury   Time 8   Status New               Plan - 01/17/17 1407    Clinical Impression Statement Patient continues to report the only thing that makes him feel better is the pain medication from his doctor, and that he will likely need this the rest of his life. He is unable to tolerate any position or activity provided in this session, reports that he felt worse after soft tissue mobilization provided in last session. If he  is unable to tolerate any standing activities in next session, will refer to pool therapist.    Rehab Potential Fair   Clinical Impairments Affecting Rehab Potential Positive: age; Negative: does not believe physical therapy will help, does not appear motivated to participate   PT Frequency 2x / week   PT Duration 8 weeks   PT Treatment/Interventions Aquatic Therapy;Cryotherapy;Electrical Stimulation;Iontophoresis 4mg /ml Dexamethasone;Moist Heat;Traction;Ultrasound;DME Instruction;Gait training;Stair training;Functional mobility training;Therapeutic activities;Therapeutic exercise;Balance training;Neuromuscular re-education;Patient/family education;Manual techniques;Passive range of motion;Dry  needling   PT Next Visit Plan Progress with pain modalities until he is able to progress to more aggressive manual therapy and ther-ex   PT Home Exercise Plan Only thing pt will tolerate is laying on stomach for brief bout with pillow under hips. Encouraged him to perform 5 minutes every hour. Progress to flat on stomach, propped on elbows, and finally prone press-up   Consulted and Agree with Plan of Care Patient      Patient will benefit from skilled therapeutic intervention in order to improve the following deficits and impairments:  Abnormal gait, Decreased range of motion, Decreased strength, Pain  Visit Diagnosis: Acute midline low back pain with left-sided sciatica  Other abnormalities of gait and mobility     Problem List There are no active problems to display for this patient.  Alva Garnet PT, DPT, CSCS    01/17/2017, 2:23 PM  [facility] PHYSICAL AND SPORTS MEDICINE [address] [place], [address] Phone: [phone]   Fax:  [phone]  Name: Walfred Bettendorf MRN: [id number] Date of Birth: [birth year]

## 2017-01-19 ENCOUNTER — Encounter: Payer: PRIVATE HEALTH INSURANCE | Admitting: Physical Therapy

## 2017-01-19 ENCOUNTER — Ambulatory Visit: Payer: PRIVATE HEALTH INSURANCE | Admitting: Physical Therapy

## 2017-01-19 DIAGNOSIS — M5442 Lumbago with sciatica, left side: Secondary | ICD-10-CM

## 2017-01-19 DIAGNOSIS — R2689 Other abnormalities of gait and mobility: Secondary | ICD-10-CM

## 2017-01-19 NOTE — Therapy (Signed)
[facility] PHYSICAL AND SPORTS MEDICINE [address] [place], [place], [address] Phone: [phone]   Fax:  [phone]  Physical Therapy Treatment  Patient Details  Name: Darin Murphy MRN: [id number] Date of Birth: 09/08/1971 Referring Provider: Tressie StalkerJenkins, Jeffrey  Encounter Date: 01/19/2017      PT End of Session - 01/19/17 1459    Visit Number 5   Number of Visits 16   Date for PT Re-Evaluation 03/03/17   Authorization Type work comp auth 16 visits   PT Start Time 1121   PT Stop Time 1136   PT Time Calculation (min) 15 min   Activity Tolerance Patient limited by pain   Behavior During Therapy Flat affect  Tearful      No past medical history on file.  No past surgical history on file.  There were no vitals filed for this visit.      Subjective Assessment - 01/19/17 1455    Subjective Patient reports he was in severe pain after the previous session, he was not able to sleep last night. He reports he is taking his medicine, which is the only thing he finds beneficial.    Patient is accompained by: Family member  Initially young male present with patient but she leaves before pt retrieved from waiting room   Pertinent History Darin Murphy is a 46 y.o.male who was lifting 100 pound boxes while at work on 11/23/16. He had 4 boxes to lift and move and reports that by the time he got to the third box he felt a pop and had severe pain in his lower back. This caused him to fall forward but he denies head injury, neck pain. States that since the injury is had severe lower back pain that is worse on the left. Has pain that intermittently runs down to the L foot. Also notes that he urinated a small amount at the time of the injury but has had control of his bowel and bladder since that time. He does report increased frequency of urination since the injury and plans to make an appointment with a doctor to discuss. Denies saddle paresthesia. Pt reports he  had a similar injury 6-8 years ago but states that it improved with medication. He had a few bouts of physical therapy at that time but states that it did not help. He denies any history of back surgeries. MRI showed broad left eccentric disc bulge at L5-S1, closely approximating the transiting left S1 nerve root without neural impingement or stenosis and shallow right foraminal disc protrusion at L4-5, closely approximating the transiting right L4 nerve root without impingement or stenosis. Pt saw neurosurgery who recommended PT as well as spinal injection. Pt has an appointment for the injection scheduled toward the end of February but he cannot remember the exact date. Of note there appears to be somewhat of a language barrier present, as pt does not appear to understand all of the questions that are asked by therapist. ROS negative for red flags   How long can you sit comfortably? 15 minutes   How long can you stand comfortably? 1 hr   How long can you walk comfortably? 5 minutes   Diagnostic tests MRI: broad left eccentric disc bulge at L5-S1, closely approximating the transiting left S1 nerve root without neural impingement or stenosis and shallow right foraminal disc protrusion at L4-5, closely approximating the transiting right L4 nerve root without impingement or stenosis   Patient Stated Goals Decrease pain  Currently in Pain? Yes  See above    Pain Location Back   Pain Orientation Left;Right;Lower   Pain Descriptors / Indicators Aching   Pain Type Acute pain   Pain Onset More than a month ago   Pain Frequency Constant      Attempted standing exercise (lateral walking on blue foam pad with HHA from treadmill) patient reports this significantly increases his pain   Attempted to have patient lie in supine for supine exercises, patient unable to tolerate even getting into supine position with pillow underneath his back before he begins crying in pain. At this point PT called Worker's  Compensation agent and MD office as patient is really not able to tolerate any PT modality at this time. Patient continues to report the only thing that makes him feel better is the medication being given.                            PT Education - 01/19/17 1458    Education provided Yes   Education Details Will contact MD, may benefit from pool therapy.    Person(s) Educated Patient   Methods Explanation;Demonstration   Comprehension Verbalized understanding;Returned demonstration             PT Long Term Goals - 01/06/17 1030      PT LONG TERM GOAL #1   Title Pt will be independent with HEP in order to improve strength and balance in order to decrease fall risk and improve function at home and work.    Time 8   Period Weeks   Status New     PT LONG TERM GOAL #2   Title  Pt will decrease mODI scoreby at least 13 points in order demonstrate clinically significant reduction in pain/disability    Baseline 01/05/17: 78%   Time 8   Status New     PT LONG TERM GOAL #3   Title Pt will decrease low back pain to at worst 7/10 on NPRS in order to demonstrate clinically significant reduction in back pain   Baseline 01/05/17: 9/10   Time 8   Period Weeks   Status New     PT LONG TERM GOAL #4   Title Pt will demonstrate proper lifting mechanics from floor to overhead in order to prevent reccurrence of low back injury   Time 8   Status New               Plan - 01/19/17 1500    Clinical Impression Statement Patient continues to report high levels of pain, unable to tolerate laying in supine, sidestepping, flexion or extension. Discussed with worker's comp authorizing provider, per MRI report and MD notes it sounds like there is not an obvious pain generating structure for which surgery is a viable option. Of note patient did ask if when PT had contacted MD if he had asked doctor about getting more medicine. Patient has not made any progress thus far with  therapy, and all appropriate "land-based" modalities have been used (soft tissue mobilization, heat, e-stim, non-weightbearing exercises). At this point he may be more appropriate for pool based therapy, will work on getting onto pool therapist's schedule as soon as possible.    Rehab Potential Fair   Clinical Impairments Affecting Rehab Potential Positive: age; Negative: does not believe physical therapy will help, does not appear motivated to participate   PT Frequency 2x / week   PT Duration 8 weeks  PT Treatment/Interventions Aquatic Therapy;Cryotherapy;Electrical Stimulation;Iontophoresis 4mg /ml Dexamethasone;Moist Heat;Traction;Ultrasound;DME Instruction;Gait training;Stair training;Functional mobility training;Therapeutic activities;Therapeutic exercise;Balance training;Neuromuscular re-education;Patient/family education;Manual techniques;Passive range of motion;Dry needling   PT Next Visit Plan Progress with pain modalities until he is able to progress to more aggressive manual therapy and ther-ex   PT Home Exercise Plan Only thing pt will tolerate is laying on stomach for brief bout with pillow under hips. Encouraged him to perform 5 minutes every hour. Progress to flat on stomach, propped on elbows, and finally prone press-up   Consulted and Agree with Plan of Care Patient      Patient will benefit from skilled therapeutic intervention in order to improve the following deficits and impairments:  Abnormal gait, Decreased range of motion, Decreased strength, Pain  Visit Diagnosis: Acute midline low back pain with left-sided sciatica  Other abnormalities of gait and mobility     Problem List There are no active problems to display for this patient.  Alva Garnet PT, DPT, CSCS    01/19/2017, 3:11 PM  [facility] PHYSICAL AND SPORTS MEDICINE [address] [place], [address] Phone: [phone]   Fax:  [phone]  Name: Darin Murphy MRN: [id number] Date of Birth: [birth year]

## 2017-01-23 ENCOUNTER — Ambulatory Visit: Payer: PRIVATE HEALTH INSURANCE | Admitting: Physical Therapy

## 2017-01-26 ENCOUNTER — Ambulatory Visit: Payer: PRIVATE HEALTH INSURANCE | Admitting: Physical Therapy

## 2017-01-30 ENCOUNTER — Ambulatory Visit: Payer: PRIVATE HEALTH INSURANCE | Admitting: Physical Therapy

## 2017-01-31 ENCOUNTER — Telehealth: Payer: Self-pay | Admitting: Physical Therapy

## 2017-01-31 NOTE — Telephone Encounter (Signed)
PT called number provided by adjuster for worker's compensation. Informed answering party Mr. Tremain's PT was calling to schedule appointment, provided call back number. Will await return phone call.   Darin GarnetPatrick Murphy PT, DPT, CSCS

## 2017-02-02 ENCOUNTER — Encounter: Payer: PRIVATE HEALTH INSURANCE | Admitting: Physical Therapy

## 2017-02-02 ENCOUNTER — Ambulatory Visit: Payer: PRIVATE HEALTH INSURANCE | Attending: Neurosurgery | Admitting: Physical Therapy

## 2017-02-02 DIAGNOSIS — R2689 Other abnormalities of gait and mobility: Secondary | ICD-10-CM | POA: Diagnosis present

## 2017-02-02 DIAGNOSIS — M5442 Lumbago with sciatica, left side: Secondary | ICD-10-CM | POA: Insufficient documentation

## 2017-02-03 NOTE — Therapy (Addendum)
[place] MAIN [facility] SERVICES [address] [place], [place], [address] Phone: [phone]   Fax:  [phone]  Physical Therapy Treatment  Patient Details  Name: Darin Murphy MRN: [id number] Date of Birth: Apr 19, 1971 Referring Provider: Tressie Stalker  Encounter Date: 02/02/2017      PT End of Session - 02/03/17 2001    Visit Number 6   Number of Visits 16   Date for PT Re-Evaluation 03/03/17   Authorization Type work comp auth 16 visits   PT Start Time 0945   PT Stop Time 1030   PT Time Calculation (min) 45 min   Activity Tolerance Patient tolerated treatment well;No increased pain   Behavior During Therapy [facility] for tasks assessed/performed      No past medical history on file.  No past surgical history on file.  There were no vitals filed for this visit.      Subjective Assessment - 02/03/17 1958    Subjective Pt reports his pain interrupts his sleep. Pt voiced " I hate myself" while walking with pain during the session "I am too young for this".  Pt reported he walks and understands he has to keep walking. Pt will see his MD soon.    Patient is accompained by: Family member  Initially young male present with patient but she leaves before pt retrieved from waiting room   Pertinent History Darin Murphy is a 46 y.o.male who was lifting 100 pound boxes while at work on 11/23/16. He had 4 boxes to lift and move and reports that by the time he got to the third box he felt a pop and had severe pain in his lower back. This caused him to fall forward but he denies head injury, neck pain. States that since the injury is had severe lower back pain that is worse on the left. Has pain that intermittently runs down to the L foot. Also notes that he urinated a small amount at the time of the injury but has had control of his bowel and bladder since that time. He does report increased frequency of urination since the injury and plans to make an  appointment with a doctor to discuss. Denies saddle paresthesia. Pt reports he had a similar injury 6-8 years ago but states that it improved with medication. He had a few bouts of physical therapy at that time but states that it did not help. He denies any history of back surgeries. MRI showed broad left eccentric disc bulge at L5-S1, closely approximating the transiting left S1 nerve root without neural impingement or stenosis and shallow right foraminal disc protrusion at L4-5, closely approximating the transiting right L4 nerve root without impingement or stenosis. Pt saw neurosurgery who recommended PT as well as spinal injection. Pt has an appointment for the injection scheduled toward the end of February but he cannot remember the exact date. Of note there appears to be somewhat of a language barrier present, as pt does not appear to understand all of the questions that are asked by therapist. ROS negative for red flags   How long can you sit comfortably? 15 minutes   How long can you stand comfortably? 1 hr   How long can you walk comfortably? 5 minutes   Diagnostic tests MRI: broad left eccentric disc bulge at L5-S1, closely approximating the transiting left S1 nerve root without neural impingement or stenosis and shallow right foraminal disc protrusion at L4-5, closely approximating the transiting right L4 nerve root  without impingement or stenosis   Patient Stated Goals Decrease pain   Pain Onset More than a month ago                     Adult Aquatic Therapy - 02/03/17 1959      Aquatic Therapy Subjective   Subjective Pt reported enjoying the pool session and will go to the gym pool.          O: Pt entered  the pool via steps with single UE support on rail. Wincing pain with R LE descent. Exited ramp with single UE.   50 ft =1 lap  Exercises performed in 3'6" depth     Did not tolerate transition to noodles to get into supine position for floating exercise. Deferred  to supporting back against wall to off load spine  Stretches : (Cuing and education for graded exposure and pain science and breathing to tolerate range of motion)  ROM of each joint Achieved ~5 degree of motion in all 6 directions of cervical and spine)  Demo'd proper paced breathing to tolerate session Shoulder ER/adduction/abduction ~15 deg    Cued for less heel striking to minimize radiating pain  Provided motivational interviewing and biopsychosocial approaches ( recommended positive self-talk, encouragement on progress with pain management)                      PT Education - 02/03/17 2001    Education provided Yes   Education Details graded movement, movement before point of pain, biopsychosocial approaches , pain science, positive self-talk   Person(s) Educated Patient   Methods Explanation;Demonstration;Tactile cues;Verbal cues   Comprehension Returned demonstration;Verbalized understanding             PT Long Term Goals - 02/03/17 2002      PT LONG TERM GOAL #1   Title Pt will be independent with HEP in order to improve strength and balance in order to decrease fall risk and improve function at home and work.    Time 8   Period Weeks   Status On-going     PT LONG TERM GOAL #2   Title  Pt will decrease mODI scoreby at least 13 points in order demonstrate clinically significant reduction in pain/disability    Baseline 01/05/17: 78%   Time 8   Status On-going     PT LONG TERM GOAL #3   Title Pt will decrease low back pain to at worst 7/10 on NPRS in order to demonstrate clinically significant reduction in back pain   Baseline 01/05/17: 9/10   Time 8   Period Weeks   Status On-going     PT LONG TERM GOAL #4   Title Pt will demonstrate proper lifting mechanics from floor to overhead in order to prevent reccurrence of low back injury   Time 8   Status On-going     PT LONG TERM GOAL #5   Title Pt will tolerate duration of 30 min pool session with  decreased demonstration of wincing to 25% of the time during the session in order to demo understanding of graded movement principles and to progress to additional aquatic Tx to manage pain   Time 12   Period Weeks   Status New     Additional Long Term Goals   Additional Long Term Goals Yes     PT LONG TERM GOAL #6   Title Pt will demo walking on fore/midfoot with wider BOS without cuing in  order to minimzie radiating pain and to engage in proper lower kinetic chain and lumbopelvic control for increased walking   Time 12   Period Weeks   Status New               Plan - 02/03/17 2002    Clinical Impression Statement Pt tolerated pool session with guidance on pain science, graded exposure, moving within pain free range. Pt did not tolerate semi reclined/supine position on noodles and exercises were deferred to a minisquat/standing by a wall position to offload spine. Pt reported enjoying the pool and will plan to go to his gym pool.  Pt has been scheduled for more pool visits and will continue to benefit from skilled PT   Rehab Potential Fair   Clinical Impairments Affecting Rehab Potential Positive: age; Negative: does not believe physical therapy will help, does not appear motivated to participate   PT Frequency 2x / week   PT Duration 8 weeks   PT Treatment/Interventions Aquatic Therapy;Cryotherapy;Electrical Stimulation;Iontophoresis 4mg /ml Dexamethasone;Moist Heat;Traction;Ultrasound;DME Instruction;Gait training;Stair training;Functional mobility training;Therapeutic activities;Therapeutic exercise;Balance training;Neuromuscular re-education;Patient/family education;Manual techniques;Passive range of motion;Dry needling   PT Next Visit Plan Progress with pain modalities until he is able to progress to more aggressive manual therapy and ther-ex   PT Home Exercise Plan Only thing pt will tolerate is laying on stomach for brief bout with pillow under hips. Encouraged him to perform 5  minutes every hour. Progress to flat on stomach, propped on elbows, and finally prone press-up   Consulted and Agree with Plan of Care Patient      Patient will benefit from skilled therapeutic intervention in order to improve the following deficits and impairments:  Abnormal gait, Decreased range of motion, Decreased strength, Pain  Visit Diagnosis: Acute midline low back pain with left-sided sciatica  Other abnormalities of gait and mobility     Problem List There are no active problems to display for this patient.   Mariane Masters ,PT, DPT, E-RYT  02/03/2017, 8:05 PM  [place] [facility] MAIN [facility] SERVICES [address] [place], [place], [address] Phone: [phone]   Fax:  [phone]  Name: Darin Murphy MRN: [id number] Date of Birth: 12/31/1970

## 2017-02-06 ENCOUNTER — Ambulatory Visit: Payer: PRIVATE HEALTH INSURANCE | Admitting: Physical Therapy

## 2017-02-08 ENCOUNTER — Ambulatory Visit: Payer: PRIVATE HEALTH INSURANCE | Admitting: Physical Therapy

## 2017-02-09 ENCOUNTER — Encounter: Payer: Self-pay | Admitting: Physical Therapy

## 2017-02-13 ENCOUNTER — Encounter: Payer: Self-pay | Admitting: Physical Therapy

## 2017-02-13 ENCOUNTER — Encounter: Payer: PRIVATE HEALTH INSURANCE | Admitting: Physical Therapy

## 2017-02-15 ENCOUNTER — Encounter: Payer: PRIVATE HEALTH INSURANCE | Admitting: Physical Therapy

## 2017-02-16 ENCOUNTER — Ambulatory Visit: Payer: PRIVATE HEALTH INSURANCE | Attending: Neurosurgery | Admitting: Physical Therapy

## 2017-02-16 ENCOUNTER — Encounter: Payer: Self-pay | Admitting: Physical Therapy

## 2017-02-16 DIAGNOSIS — M5442 Lumbago with sciatica, left side: Secondary | ICD-10-CM | POA: Insufficient documentation

## 2017-02-16 DIAGNOSIS — R2689 Other abnormalities of gait and mobility: Secondary | ICD-10-CM | POA: Insufficient documentation

## 2017-02-16 NOTE — Therapy (Signed)
[place] [facility] MAIN [facility] SERVICES [address] [place], [place], [address] Phone: [phone]   Fax:  [phone]  Physical Therapy Treatment  Patient Details  Name: Darin Murphy MRN: [id number] Date of Birth: [birth year] Referring Provider: Tressie Stalker  Encounter Date: 02/16/2017      PT End of Session - 02/16/17 1044    Visit Number 7   Number of Visits 16   Date for PT Re-Evaluation 03/03/17   Authorization Type work comp auth 16 visits   PT Start Time 0820   PT Stop Time 0900   PT Time Calculation (min) 40 min   Activity Tolerance Patient tolerated treatment well;No increased pain   Behavior During Therapy [facility] for tasks assessed/performed      No past medical history on file.  No past surgical history on file.  There were no vitals filed for this visit.      Subjective Assessment - 02/16/17 1043    Subjective Pt reported he has been walking and also has gone to the pool 2 x/week last week with his son assisting him. Pt continues to report pain down his legs and not being able to stand. sit. drive for long. Pt uses a pillow behind his back   Patient is accompained by: Family member  Initially young male present with patient but she leaves before pt retrieved from waiting room   Pertinent History Darin Murphy is a 46 y.o.male who was lifting 100 pound boxes while at work on 11/23/16. He had 4 boxes to lift and move and reports that by the time he got to the third box he felt a pop and had severe pain in his lower back. This caused him to fall forward but he denies head injury, neck pain. States that since the injury is had severe lower back pain that is worse on the left. Has pain that intermittently runs down to the L foot. Also notes that he urinated a small amount at the time of the injury but has had control of his bowel and bladder since that time. He does report increased frequency of urination since the injury and plans to make an  appointment with a doctor to discuss. Denies saddle paresthesia. Pt reports he had a similar injury 6-8 years ago but states that it improved with medication. He had a few bouts of physical therapy at that time but states that it did not help. He denies any history of back surgeries. MRI showed broad left eccentric disc bulge at L5-S1, closely approximating the transiting left S1 nerve root without neural impingement or stenosis and shallow right foraminal disc protrusion at L4-5, closely approximating the transiting right L4 nerve root without impingement or stenosis. Pt saw neurosurgery who recommended PT as well as spinal injection. Pt has an appointment for the injection scheduled toward the end of February but he cannot remember the exact date. Of note there appears to be somewhat of a language barrier present, as pt does not appear to understand all of the questions that are asked by therapist. ROS negative for red flags   How long can you sit comfortably? 15 minutes   How long can you stand comfortably? 1 hr   How long can you walk comfortably? 5 minutes   Diagnostic tests MRI: broad left eccentric disc bulge at L5-S1, closely approximating the transiting left S1 nerve root without neural impingement or stenosis and shallow right foraminal disc protrusion at L4-5, closely approximating the transiting right L4  nerve root without impingement or stenosis   Patient Stated Goals Decrease pain   Pain Onset More than a month ago                     Adult Aquatic Therapy - 02/16/17 1044      Aquatic Therapy Subjective   Subjective Pt had no complaints with the pool session today        O: Pt entered/exited the pool via ramp with UE support on rail.  50 ft =1 lap  Exercises performed in 3'6" depth   Wall stretches: minisquat  For traction of spine, educated about lifting with proper mechanics Unable to cross R ankle over L for modified piriformis stretch  Sidestepping ~ 10 ft x  L x 2 sets, R x 2 sets with BUE on wall with small increments within pain free range  Sidestepping ~ 5 ft x L x 1 set, R x 1 set with noodles under arms with small increments within pain free range  Relaxation with noodles behind back by wall, wall squat position, cued for equal weight bearing to minimize pain  Education on strengthening hip abd mm for support of spine/hips                   PT Long Term Goals - 02/03/17 2002      PT LONG TERM GOAL #1   Title Pt will be independent with HEP in order to improve strength and balance in order to decrease fall risk and improve function at home and work.    Time 8   Period Weeks   Status On-going     PT LONG TERM GOAL #2   Title  Pt will decrease mODI scoreby at least 13 points in order demonstrate clinically significant reduction in pain/disability    Baseline 01/05/17: 78%   Time 8   Status On-going     PT LONG TERM GOAL #3   Title Pt will decrease low back pain to at worst 7/10 on NPRS in order to demonstrate clinically significant reduction in back pain   Baseline 01/05/17: 9/10   Time 8   Period Weeks   Status On-going     PT LONG TERM GOAL #4   Title Pt will demonstrate proper lifting mechanics from floor to overhead in order to prevent reccurrence of low back injury   Time 8   Status On-going     PT LONG TERM GOAL #5   Title Pt will tolerate duration of 30 min pool session with decreased demonstration of wincing to 25% of the time during the session in order to demo understanding of graded movement principles and to progress to additional aquatic Tx to manage pain   Time 12   Period Weeks   Status New     Additional Long Term Goals   Additional Long Term Goals Yes     PT LONG TERM GOAL #6   Title Pt will demo walking on fore/midfoot with wider BOS without cuing in order to minimzie radiating pain and to engage in proper lower kinetic chain and lumbopelvic control for increased walking   Time 12   Period  Weeks   Status New               Plan - 02/16/17 1044    Clinical Impression Statement Pt demo'd increased functional mobility compared to last session. Pt demo'd significantly less guarding and showed increased ROM and gait stride today.  Pt advanced to sidestepping exercise. Pt was not able to demo increased hip flexion on R due to pain on R >L.  Pt demo'd understanding grade movement, moving before the point of pain IND with less cuing. Pt continues to prefer pool Tx and deferred land Tx. Pt continues to benefit from skilled PT.   Rehab Potential Fair   Clinical Impairments Affecting Rehab Potential Positive: age; Negative: does not believe physical therapy will help, does not appear motivated to participate   PT Frequency 2x / week   PT Duration 8 weeks   PT Treatment/Interventions Aquatic Therapy;Cryotherapy;Electrical Stimulation;Iontophoresis 4mg /ml Dexamethasone;Moist Heat;Traction;Ultrasound;DME Instruction;Gait training;Stair training;Functional mobility training;Therapeutic activities;Therapeutic exercise;Balance training;Neuromuscular re-education;Patient/family education;Manual techniques;Passive range of motion;Dry needling   PT Next Visit Plan Progress with pain modalities until he is able to progress to more aggressive manual therapy and ther-ex   PT Home Exercise Plan Only thing pt will tolerate is laying on stomach for brief bout with pillow under hips. Encouraged him to perform 5 minutes every hour. Progress to flat on stomach, propped on elbows, and finally prone press-up   Consulted and Agree with Plan of Care Patient      Patient will benefit from skilled therapeutic intervention in order to improve the following deficits and impairments:  Abnormal gait, Decreased range of motion, Decreased strength, Pain  Visit Diagnosis: Acute midline low back pain with left-sided sciatica  Other abnormalities of gait and mobility     Problem List There are no active  problems to display for this patient.   Mariane MastersYeung,Shin Yiing ,PT, DPT, E-RYT  02/16/2017, 10:47 AM  [facility] [address] [place], [place], [address] Phone: [phone]   Fax:  [phone]  Name: Darin Murphy MRN: [id number] Date of Birth: 09/10/1971

## 2017-02-23 ENCOUNTER — Ambulatory Visit: Payer: PRIVATE HEALTH INSURANCE | Attending: Neurosurgery | Admitting: Physical Therapy

## 2017-02-23 DIAGNOSIS — R2689 Other abnormalities of gait and mobility: Secondary | ICD-10-CM | POA: Insufficient documentation

## 2017-02-23 DIAGNOSIS — M5442 Lumbago with sciatica, left side: Secondary | ICD-10-CM | POA: Diagnosis not present

## 2017-02-23 NOTE — Therapy (Signed)
[place] [facility] [address] [place], [place], [address] Phone: [phone]   Fax:  [phone]  Physical Therapy Treatment  Patient Details  Name: Darin Murphy MRN: [id number] Date of Birth: 10/08/1971 Referring Provider: Tressie StalkerJenkins, Jeffrey  Encounter Date: 02/23/2017      PT End of Session - 02/23/17 0847    Visit Number 8   Number of Visits 16   Date for PT Re-Evaluation 03/03/17   Authorization Type work comp auth 16 visits   PT Start Time 0820   PT Stop Time 0905   PT Time Calculation (min) 45 min   Activity Tolerance Patient tolerated treatment well;No increased pain   Behavior [facility] for tasks assessed/performed      No past medical history on file.  No past surgical history on file.  There were no vitals filed for this visit.      Subjective Assessment - 02/23/17 0841    Subjective Pt reports he has been walking on land and paying attention to walking more on the front of his feet to minimize radiating pain.   Patient is accompained by: Family member  Initially young male present with patient but she leaves before pt retrieved from waiting room   Pertinent History Darin Murphy is a 46 y.o.male who was lifting 100 pound boxes while at work on 11/23/16. He had 4 boxes to lift and move and reports that by the time he got to the third box he felt a pop and had severe pain in his lower back. This caused him to fall forward but he denies head injury, neck pain. States that since the injury is had severe lower back pain that is worse on the left. Has pain that intermittently runs down to the L foot. Also notes that he urinated a small amount at the time of the injury but has had control of his bowel and bladder since that time. He does report increased frequency of urination since the injury and plans to make an appointment with a doctor to discuss. Denies saddle paresthesia. Pt reports he had a similar injury 6-8  years ago but states that it improved with medication. He had a few bouts of physical therapy at that time but states that it did not help. He denies any history of back surgeries. MRI showed broad left eccentric disc bulge at L5-S1, closely approximating the transiting left S1 nerve root without neural impingement or stenosis and shallow right foraminal disc protrusion at L4-5, closely approximating the transiting right L4 nerve root without impingement or stenosis. Pt saw neurosurgery who recommended PT as well as spinal injection. Pt has an appointment for the injection scheduled toward the end of February but he cannot remember the exact date. Of note there appears to be somewhat of a language barrier present, as pt does not appear to understand all of the questions that are asked by therapist. ROS negative for red flags   How long can you sit comfortably? 15 minutes   How long can you stand comfortably? 1 hr   How long can you walk comfortably? 5 minutes   Diagnostic tests MRI: broad left eccentric disc bulge at L5-S1, closely approximating the transiting left S1 nerve root without neural impingement or stenosis and shallow right foraminal disc protrusion at L4-5, closely approximating the transiting right L4 nerve root without impingement or stenosis   Patient Stated Goals Decrease pain   Pain Onset More than a month ago  Adult Aquatic Therapy - 02/23/17 0842      Aquatic Therapy Subjective   Subjective Pt had no complaints with the pool session today        O: Pt entered/exited the pool via ramp with UE support on rail.  50 ft =1 lap  Exercises performed in 3'6" depth    Walked sideways into ramp with BUE on rail. Cued for feet placement for wide BOS  Stretches by wall. :  Back traction Semi tandem hip flexor stretch, B   Seated gentle ROM 5': scapular retraction  , cued for elbow placement to decrease pain,  leg   Seated Lateral scoots L and R  ~5 ft each way   Walking backwards with noodles with shoulder retraction, shoulder ER 10 ft and forward 10 ft   Resting by wall against noodles   Walked sideways out of ramp with BUE on rail.                     PT Long Term Goals - 02/23/17 0851      PT LONG TERM GOAL #1   Title Pt will be independent with HEP in order to improve strength and balance in order to decrease fall risk and improve function at home and work.    Time 8   Period Weeks   Status On-going     PT LONG TERM GOAL #2   Title  Pt will decrease mODI scoreby at least 13 points in order demonstrate clinically significant reduction in pain/disability    Baseline 01/05/17: 78%   Time 8   Status On-going     PT LONG TERM GOAL #3   Title Pt will decrease low back pain to at worst 7/10 on NPRS in order to demonstrate clinically significant reduction in back pain   Baseline 01/05/17: 9/10   Time 8   Period Weeks   Status On-going     PT LONG TERM GOAL #4   Title Pt will demonstrate proper lifting mechanics from floor to overhead in order to prevent reccurrence of low back injury   Time 8   Status On-going     PT LONG TERM GOAL #5   Title Pt will tolerate duration of 30 min pool session with decreased demonstration of wincing to 25% of the time during the session in order to demo understanding of graded movement principles and to progress to additional aquatic Tx to manage pain   Time 12   Period Weeks   Status New     PT LONG TERM GOAL #6   Title Pt will demo walking on fore/midfoot with wider BOS without cuing in order to minimzie radiating pain and to engage in proper lower kinetic chain and lumbopelvic control for increased walking   Time 12   Period Weeks   Status New               Plan - 02/23/17 0848    Clinical Impression Statement Pt progressed to backward walking with noodles today without complaints. Emphasized scapular retraction with shoulder ER to improve posture and  increased hip flexion with gait to promote lumbopelvic mobility.  Pt showed less guarding and had brighter affect. Pt demonstrates motivation. Pt continues to benefit from skilled PT.     Rehab Potential Fair   Clinical Impairments Affecting Rehab Potential Positive: age; Negative: does not believe physical therapy will help, does not appear motivated to participate   PT Frequency 2x / week  PT Duration 8 weeks   PT Treatment/Interventions Aquatic Therapy;Cryotherapy;Electrical Stimulation;Iontophoresis 4mg /ml Dexamethasone;Moist Heat;Traction;Ultrasound;DME Instruction;Gait training;Stair training;Functional mobility training;Therapeutic activities;Therapeutic exercise;Balance training;Neuromuscular re-education;Patient/family education;Manual techniques;Passive range of motion;Dry needling   PT Next Visit Plan Progress with pain modalities until he is able to progress to more aggressive manual therapy and ther-ex   PT Home Exercise Plan Only thing pt will tolerate is laying on stomach for brief bout with pillow under hips. Encouraged him to perform 5 minutes every hour. Progress to flat on stomach, propped on elbows, and finally prone press-up   Consulted and Agree with Plan of Care Patient      Patient will benefit from skilled therapeutic intervention in order to improve the following deficits and impairments:  Abnormal gait, Decreased range of motion, Decreased strength, Pain  Visit Diagnosis: Acute midline low back pain with left-sided sciatica  Other abnormalities of gait and mobility     Problem List There are no active problems to display for this patient.   Mariane Masters ,PT, DPT, E-RYT  02/23/2017, 8:52 AM  [place] MAIN [facility] SERVICES [address] [place], [place], [address] Phone: [phone]   Fax:  [phone]  Name: Darin Murphy MRN: [id number] Date of Birth: [birth year]

## 2017-03-02 ENCOUNTER — Ambulatory Visit: Payer: PRIVATE HEALTH INSURANCE | Admitting: Physical Therapy

## 2017-03-02 DIAGNOSIS — M5442 Lumbago with sciatica, left side: Secondary | ICD-10-CM | POA: Diagnosis not present

## 2017-03-02 DIAGNOSIS — R2689 Other abnormalities of gait and mobility: Secondary | ICD-10-CM

## 2017-03-03 NOTE — Therapy (Addendum)
[facility] MAIN [facility] SERVICES [address] [place], [place], [address] Phone: [phone]   Fax:  [phone]  Physical Therapy Treatment  Patient Details  Name: Darin Murphy MRN: [id number] Date of Birth: 1971-02-24 Referring Provider: Tressie Stalker  Encounter Date: 03/02/2017      PT End of Session - 03/03/17 1839    Visit Number 9   Number of Visits 16   Date for PT Re-Evaluation 03/03/17   Authorization Type work comp auth 16 visits   Activity Tolerance Patient tolerated treatment well;No increased pain   Behavior During Therapy [facility] for tasks assessed/performed      No past medical history on file.  No past surgical history on file.  There were no vitals filed for this visit.      Subjective Assessment - 03/03/17 1829    Subjective Pt reports he had increased pain that radiates down his L leg > R that woke him in the night. He reports he almost fell but did not due to the pain.    Patient is accompained by: Family member  Initially young male present with patient but she leaves before pt retrieved from waiting room   Pertinent History Cliff Id is a 46 y.o.male who was lifting 100 pound boxes while at work on 11/23/16. He had 4 boxes to lift and move and reports that by the time he got to the third box he felt a pop and had severe pain in his lower back. This caused him to fall forward but he denies head injury, neck pain. States that since the injury is had severe lower back pain that is worse on the left. Has pain that intermittently runs down to the L foot. Also notes that he urinated a small amount at the time of the injury but has had control of his bowel and bladder since that time. He does report increased frequency of urination since the injury and plans to make an appointment with a doctor to discuss. Denies saddle paresthesia. Pt reports he had a similar injury 6-8 years ago but states that it improved with medication. He had  a few bouts of physical therapy at that time but states that it did not help. He denies any history of back surgeries. MRI showed broad left eccentric disc bulge at L5-S1, closely approximating the transiting left S1 nerve root without neural impingement or stenosis and shallow right foraminal disc protrusion at L4-5, closely approximating the transiting right L4 nerve root without impingement or stenosis. Pt saw neurosurgery who recommended PT as well as spinal injection. Pt has an appointment for the injection scheduled toward the end of February but he cannot remember the exact date. Of note there appears to be somewhat of a language barrier present, as pt does not appear to understand all of the questions that are asked by therapist. ROS negative for red flags   How long can you sit comfortably? 15 minutes   How long can you stand comfortably? 1 hr   How long can you walk comfortably? 5 minutes   Diagnostic tests MRI: broad left eccentric disc bulge at L5-S1, closely approximating the transiting left S1 nerve root without neural impingement or stenosis and shallow right foraminal disc protrusion at L4-5, closely approximating the transiting right L4 nerve root without impingement or stenosis   Patient Stated Goals Decrease pain   Pain Onset More than a month ago  Adult Aquatic Therapy - 03/03/17 1839      Aquatic Therapy Subjective   Subjective Pt had no complaints with the pool session today       O: Pt entered/ exited  in a WC assisted by PT via ramp because he appeared limping to appt.    50 ft =1 lap  Leaning against wall and seated  Small ROM with arms to promote scapular stabilization, cued for breathing to relax 5' + 5'  Explained about the need for less slumped posture and more strengthening of back muscles to minimize pain   Side scoots along bench 10 ft L and 10 ft R   Seated leg small gentle movements 5' with relaxation training                              PT Long Term Goals - 02/23/17 0851      PT LONG TERM GOAL #1   Title Pt will be independent with HEP in order to improve strength and balance in order to decrease fall risk and improve function at home and work.    Time 8   Period Weeks   Status On-going     PT LONG TERM GOAL #2   Title  Pt will decrease mODI scoreby at least 13 points in order demonstrate clinically significant reduction in pain/disability    Baseline 01/05/17: 78%   Time 8   Status On-going     PT LONG TERM GOAL #3   Title Pt will decrease low back pain to at worst 7/10 on NPRS in order to demonstrate clinically significant reduction in back pain   Baseline 01/05/17: 9/10   Time 8   Period Weeks   Status On-going     PT LONG TERM GOAL #4   Title Pt will demonstrate proper lifting mechanics from floor to overhead in order to prevent reccurrence of low back injury   Time 8   Status On-going     PT LONG TERM GOAL #5   Title Pt will tolerate duration of 30 min pool session with decreased demonstration of wincing to 25% of the time during the session in order to demo understanding of graded movement principles and to progress to additional aquatic Tx to manage pain   Time 12   Period Weeks   Status New     PT LONG TERM GOAL #6   Title Pt will demo walking on fore/midfoot with wider BOS without cuing in order to minimzie radiating pain and to engage in proper lower kinetic chain and lumbopelvic control for increased walking   Time 12   Period Weeks   Status Achieved                Plan - 03/03/17 1839    Clinical Impression Statement Pt walked with a limp to pool session today, reporting significant pain that woke him up at night and had caused him near falls. Pt tolerated pool session today and demonstrated good carry over with gentle graded movement which he had learned in the previous pool visits. Modified session today due to his report of increased  pain: Pt tolerated seated lateral scoots instead of standing lateral scoots. Pt was also assisted into the pool via WC whereas in the previous 2 visits, pt was able to walk into and out of pool via ramp with UE on rail.  Pt was educated heavily on graded movement to minimize pain  and to decrease fear of movement. Pt was encouraged to work with PT on land to learn additional HEP to enhance prognosis but pt declined and wanted to wait to see his MD. Pt has received heavy education on pain science and ways for self-management of pain in the previous pool visits.  Pt continues to benefit from skilled PT as long as pt remains motivated and compliant.     Rehab Potential Fair   Clinical Impairments Affecting Rehab Potential Positive: age; Negative: does not believe physical therapy will help, does not appear motivated to participate   PT Frequency 2x / week   PT Duration 8 weeks   PT Treatment/Interventions Aquatic Therapy;Cryotherapy;Electrical Stimulation;Iontophoresis 4mg /ml Dexamethasone;Moist Heat;Traction;Ultrasound;DME Instruction;Gait training;Stair training;Functional mobility training;Therapeutic activities;Therapeutic exercise;Balance training;Neuromuscular re-education;Patient/family education;Manual techniques;Passive range of motion;Dry needling   PT Next Visit Plan Progress with pain modalities until he is able to progress to more aggressive manual therapy and ther-ex   PT Home Exercise Plan Only thing pt will tolerate is laying on stomach for brief bout with pillow under hips. Encouraged him to perform 5 minutes every hour. Progress to flat on stomach, propped on elbows, and finally prone press-up   Consulted and Agree with Plan of Care Patient      Patient will benefit from skilled therapeutic intervention in order to improve the following deficits and impairments:  Abnormal gait, Decreased range of motion, Decreased strength, Pain  Visit Diagnosis: Acute midline low back pain with  left-sided sciatica  Other abnormalities of gait and mobility     Problem List There are no active problems to display for this patient.   Mariane Masters ,PT, DPT, E-RYT  03/03/2017, 6:40 PM  [facility] MAIN [facility] SERVICES [address] [place], [place], [address] Phone: [phone]   Fax:  [phone]  Name: Kalub Morillo MRN: [id number] Date of Birth: 03-10-1971

## 2017-03-07 ENCOUNTER — Ambulatory Visit: Payer: PRIVATE HEALTH INSURANCE | Admitting: Physical Therapy

## 2017-03-08 DIAGNOSIS — I10 Essential (primary) hypertension: Secondary | ICD-10-CM | POA: Diagnosis not present

## 2017-03-08 DIAGNOSIS — M545 Low back pain: Secondary | ICD-10-CM | POA: Diagnosis not present

## 2017-03-08 DIAGNOSIS — N4289 Other specified disorders of prostate: Secondary | ICD-10-CM | POA: Diagnosis not present

## 2017-03-08 DIAGNOSIS — Z6827 Body mass index (BMI) 27.0-27.9, adult: Secondary | ICD-10-CM | POA: Diagnosis not present

## 2017-03-09 ENCOUNTER — Ambulatory Visit: Payer: PRIVATE HEALTH INSURANCE | Attending: Physical Medicine and Rehabilitation | Admitting: Physical Therapy

## 2017-03-14 ENCOUNTER — Ambulatory Visit: Payer: PRIVATE HEALTH INSURANCE | Attending: Neurosurgery | Admitting: Physical Therapy

## 2017-03-16 ENCOUNTER — Ambulatory Visit: Payer: Self-pay | Admitting: Physical Therapy

## 2017-03-21 ENCOUNTER — Ambulatory Visit: Payer: PRIVATE HEALTH INSURANCE | Admitting: Physical Therapy

## 2017-03-23 ENCOUNTER — Ambulatory Visit: Payer: PRIVATE HEALTH INSURANCE | Admitting: Physical Therapy

## 2017-06-23 ENCOUNTER — Other Ambulatory Visit: Payer: Self-pay | Admitting: Physician Assistant

## 2017-06-23 NOTE — Telephone Encounter (Signed)
Med refill for nexium approved

## 2017-08-22 ENCOUNTER — Ambulatory Visit: Payer: Self-pay | Admitting: Physician Assistant

## 2017-08-22 VITALS — BP 120/80 | HR 86 | Temp 97.7°F

## 2017-08-22 DIAGNOSIS — J209 Acute bronchitis, unspecified: Secondary | ICD-10-CM

## 2017-08-22 MED ORDER — FLUTICASONE PROPIONATE 50 MCG/ACT NA SUSP: 2.0000 | Freq: Every day | NASAL | 6 refills | Status: DC

## 2017-08-22 MED ORDER — AZITHROMYCIN 250 MG PO TABS: ORAL_TABLET | ORAL | 0 refills | Status: DC

## 2017-08-22 MED ORDER — METHYLPREDNISOLONE 4 MG PO TBPK: ORAL_TABLET | ORAL | 0 refills | Status: DC

## 2017-08-22 MED ORDER — ESOMEPRAZOLE MAGNESIUM 40 MG PO CPDR: 40.0000 mg | DELAYED_RELEASE_CAPSULE | Freq: Every day | ORAL | 6 refills | Status: DC

## 2017-08-22 NOTE — Progress Notes (Signed)
S: C/o cough and congestion with wheezing and chest pain, chest is sore from coughing, +fever, chills, mucus is yellow, cough is sometimes dry and hacking; keeping pt awake at night;  denies cardiac type chest pain or sob, v/d, abd pain Remainder ros neg  O: vitals wnl, nad, tms clear, throat injected, neck supple no lymph, lungs with scattered wheezing, cv rrr, neuro intact, cough is congested  A:  Acute bronchitis   P:  rx medication: zpack, flonase, medrol dose pack, refill on nexium,  use otc meds, tylenol or motrin as needed for fever/chills, return if not better in 3 -5 days, return earlier if worsening

## 2018-09-06 DIAGNOSIS — R05 Cough: Secondary | ICD-10-CM | POA: Diagnosis not present

## 2018-09-06 DIAGNOSIS — J40 Bronchitis, not specified as acute or chronic: Secondary | ICD-10-CM | POA: Diagnosis not present

## 2018-10-18 DIAGNOSIS — G8929 Other chronic pain: Secondary | ICD-10-CM | POA: Diagnosis not present

## 2018-10-18 DIAGNOSIS — M545 Low back pain: Secondary | ICD-10-CM | POA: Diagnosis not present

## 2018-10-18 DIAGNOSIS — M5441 Lumbago with sciatica, right side: Secondary | ICD-10-CM | POA: Diagnosis not present

## 2018-10-24 DIAGNOSIS — M5136 Other intervertebral disc degeneration, lumbar region: Secondary | ICD-10-CM | POA: Diagnosis not present

## 2018-10-24 DIAGNOSIS — M5416 Radiculopathy, lumbar region: Secondary | ICD-10-CM | POA: Diagnosis not present

## 2019-01-24 ENCOUNTER — Ambulatory Visit (INDEPENDENT_AMBULATORY_CARE_PROVIDER_SITE_OTHER): Payer: Self-pay | Admitting: Physician Assistant

## 2019-01-24 ENCOUNTER — Encounter: Payer: Self-pay | Admitting: Physician Assistant

## 2019-01-24 VITALS — BP 140/100 | HR 80 | Temp 98.3°F | Resp 16 | Ht 66.0 in | Wt 177.0 lb

## 2019-01-24 DIAGNOSIS — R6889 Other general symptoms and signs: Secondary | ICD-10-CM

## 2019-01-24 DIAGNOSIS — J40 Bronchitis, not specified as acute or chronic: Secondary | ICD-10-CM

## 2019-01-24 LAB — POCT INFLUENZA A/B
Influenza A, POC: NEGATIVE
Influenza B, POC: NEGATIVE

## 2019-01-24 MED ORDER — ALBUTEROL SULFATE (2.5 MG/3ML) 0.083% IN NEBU
2.5000 mg | INHALATION_SOLUTION | Freq: Once | RESPIRATORY_TRACT | Status: AC
  Administered 2019-01-24: 2.5 mg via RESPIRATORY_TRACT

## 2019-01-24 MED ORDER — PSEUDOEPH-BROMPHEN-DM 30-2-10 MG/5ML PO SYRP: 5.0000 mL | ORAL_SOLUTION | Freq: Four times a day (QID) | ORAL | 0 refills | Status: AC | PRN

## 2019-01-24 MED ORDER — FLUTICASONE PROPIONATE 50 MCG/ACT NA SUSP: 2.0000 | Freq: Every day | NASAL | 0 refills | Status: DC

## 2019-01-24 MED ORDER — ALBUTEROL SULFATE HFA 108 (90 BASE) MCG/ACT IN AERS: 2.0000 | INHALATION_SPRAY | RESPIRATORY_TRACT | 0 refills | Status: AC | PRN

## 2019-01-24 NOTE — Progress Notes (Signed)
Patient ID: Renette Butters DOB: 1971/08/08 AGE: 48 y.o. MRN: [id number]   PCP: Patient, No Pcp Per   Chief Complaint:  Chief Complaint  Patient presents with  . Fever    x3d  . Cough    x3d     Subjective:    HPI:  Ibrahem Okino is a 48 y.o. male presents for evaluation  Chief Complaint  Patient presents with  . Fever    x3d  . Cough    x66d    48 year old male presents to [facility] with three day history of flu-like symptoms. Began with rhinorrhea, nasal congestion, and cough. Has since worsened. Reports fever (began yesterday evening), chills, body aches, malaise, and worsening cough. Cough constant. Intermittently dry vs productive. Cough causing dry heaving. Associated wheezing. Has taken OTC ibuprofen with minimal improvement. Denies dizziness/lightheadedness, ear pain, sinus pain, neck pain, chest pain, SOB, vomiting, abdominal pain, diarrhea.  Patient works at [employer]. Multiple sick exposures. Patient did receive this season's influenza vaccination.  Patient previously seen at [facility] on 08/22/2017 with cough, chest congestion, and wheezing. Associated fever and chills. Diagnosed with acute bronchitis. Prescribed ZPak, Medrol Dose Pak, Flonase and refilled Nexium. Patient also seen at [facility] on 03/07/2016 with 3 day history of URI symptoms. Diagnosed with acute URI. Prescribed ZPak.  Patient is a current cigarette smoker. Averages 1/3ppd. States he hasn't smoked past two days. Previous albuterol inhaler prescription with bronchitis; however, denies asthma, COPD, or emphysema diagnosis.  A limited review of symptoms was performed, pertinent positives and negatives as mentioned in HPI.  The following portions of the patient's history were reviewed and updated as appropriate: allergies, current medications and past medical history.  There are no active problems to display for this patient.   No Known  Allergies  Current Outpatient Medications on File Prior to Visit  Medication Sig Dispense Refill  . Albuterol Sulfate 108 (90 Base) MCG/ACT AEPB Inhale into the lungs.     No current facility-administered medications on file prior to visit.        Objective:   Vitals:   01/24/19 1255  BP: (!) 140/100  Pulse: 80  Resp: 16  Temp: 98.3 F (36.8 C)  SpO2: 98%     Wt Readings from Last 3 Encounters:  01/24/19 177 lb (80.3 kg)  12/24/16 175 lb (79.4 kg)  11/23/16 178 lb (80.7 kg)    Physical Exam:   General Appearance:  Patient sitting comfortably on examination table. Conversational. Peri Jefferson self-historian. In no acute distress. Afebrile.   Head:  Normocephalic, without obvious abnormality, atraumatic  Eyes:  PERRL, conjunctiva/corneas clear, EOM's intact  Ears:  Bilateral ear canals WNL. No erythema or edema. No discharge/drainage. Bilateral TMs WNL. No erythema, injection, or serous effusion. No scar tissue.  Nose: Nares normal, septum midline. Nasal mucosa with bilateral edema and clear rhinorrhea. No sinus tenderness with percussion/palpation.  Throat: Lips, mucosa, and tongue normal; teeth and gums normal. Throat reveals no erythema. Tonsils with no enlargement or exudate. Visible postnasal drip.  Neck: Supple, symmetrical, trachea midline, no adenopathy  Lungs:   Lung ausculation reveals mildly coarse breath sounds in bases bilaterally. Difficult to auscultate due to frequent coughing; triggered by talking, laughing, and deep inspiration.  Heart:  Regular rate and rhythm, S1 and S2 normal, no murmur, rub, or gallop  Extremities: Extremities normal, atraumatic, no cyanosis or edema  Pulses: 2+ and symmetric  Skin: Skin color, texture, turgor normal, no rashes  or lesions  Lymph nodes: Cervical, supraclavicular, and axillary nodes normal  Neurologic: Normal    Assessment & Plan:    Exam findings, diagnosis etiology and medication use and indications reviewed with patient.  Follow-Up and discharge instructions provided. No emergent/urgent issues found on exam.  Patient education was provided.   Patient verbalized understanding of information provided and agrees with plan of care (POC), all questions answered. The patient is advised to call or return to clinic if condition does not see an improvement in symptoms, or to seek the care of the closest emergency department if condition worsens with the below plan.    Patient given albuterol nebulizer treatment. Status post neb treatment: subjective symptom improvement, improved aeration, patient able to take a deep inspiration without triggering a cough, no audible rales/rhonchi/crackles or wheezing. 97% pulse ox.  1. Bronchitis - fluticasone (FLONASE) 50 MCG/ACT nasal spray; Place 2 sprays into both nostrils daily.  Dispense: 16 g; Refill: 0 - brompheniramine-pseudoephedrine-DM 30-2-10 MG/5ML syrup; Take 5 mLs by mouth 4 (four) times daily as needed.  Dispense: 120 mL; Refill: 0 - albuterol (PROVENTIL HFA;VENTOLIN HFA) 108 (90 Base) MCG/ACT inhaler; Inhale 2 puffs into the lungs every 4 (four) hours as needed.  Dispense: 1 Inhaler; Refill: 0  2. Flu-like symptoms - POCT Influenza A/B - albuterol (PROVENTIL) (2.5 MG/3ML) 0.083% nebulizer solution 2.5 mg  Patient with three day history of URI symptoms. Primary complaint cough. Patient with smoking history. Previous history of similar symptoms, typically once a year. Negative rapid flu test. VSS, afebrile, in no acute distress, clear lung sounds s/p albuterol nebulizer treatment. Will treat patient for bronchitis; prescribed albuterol inhaler, Bromphed cough syrup, and Flonase nasal spray (for rhinorrhea/PND). Patient requested ZPak; had discussion, though he was prescribed ZPak in the past, most likely not necessary, most likely symptoms are viral. Advised patient f/u with PCP or urgent care in 4-5 days if symptoms not improving, sooner with any worsening symptoms. Patient  agreed with plan.    Janalyn Harder, MHS, PA-C Rulon Sera, MHS, PA-C Advanced Practice Provider [facility]  [address] [facility], 1st Floor [place], [place] [address] (p):  [phone] [email or web address]

## 2019-01-24 NOTE — Patient Instructions (Addendum)
Thank you for choosing [facility] for your health care needs.  Your rapid flu test was NEGATIVE.  You have been diagnosed with an upper respiratory infection (a cold) and bronchitis (a chest cold).  Take medications as prescribed: Meds ordered this encounter  Medications  . albuterol (PROVENTIL) (2.5 MG/3ML) 0.083% nebulizer solution 2.5 mg  . fluticasone (FLONASE) 50 MCG/ACT nasal spray    Sig: Place 2 sprays into both nostrils daily.    Dispense:  16 g    Refill:  0    Order Specific Question:   Supervising Provider    Answer:   MILLER, BRIAN [[id number]]  . brompheniramine-pseudoephedrine-DM 30-2-10 MG/5ML syrup    Sig: Take 5 mLs by mouth 4 (four) times daily as needed.    Dispense:  120 mL    Refill:  0    Order Specific Question:   Supervising Provider    Answer:   MILLER, BRIAN [[id number]]  . albuterol (PROVENTIL HFA;VENTOLIN HFA) 108 (90 Base) MCG/ACT inhaler    Sig: Inhale 2 puffs into the lungs every 4 (four) hours as needed.    Dispense:  1 Inhaler    Refill:  0    Order Specific Question:   Supervising Provider    Answer:   MILLER, BRIAN [[id number]]   Recommend increase fluids; water, Gatorade, hot tea with lemon/honey, or orange juice. Rest. May use over the counter Tylenol or ibuprofen for fever and/or pain.  May use cool mist humidifier in bedroom. May prop self up with several pillows in bed, will help with cough.  Follow-up with your family physician or urgent care in 4-5 days if symptoms not improving; sooner with any worsening symptoms.  Upper Respiratory Infection, Adult An upper respiratory infection (URI) affects the nose, throat, and upper air passages. URIs are caused by germs (viruses). The most common type of URI is often called "the common cold." Medicines cannot cure URIs, but you can do things at home to relieve your symptoms. URIs usually get better within 7-10 days. Follow these instructions at home: Activity  Rest as needed.  If you have a fever, stay home  from work or school until your fever is gone, or until your doctor says you may return to work or school. ? You should stay home until you cannot spread the infection anymore (you are not contagious). ? Your doctor may have you wear a face mask so you have less risk of spreading the infection. Relieving symptoms  Gargle with a salt-water mixture 3-4 times a day or as needed. To make a salt-water mixture, completely dissolve -1 tsp of salt in 1 cup of warm water.  Use a cool-mist humidifier to add moisture to the air. This can help you breathe more easily. Eating and drinking   Drink enough fluid to keep your pee (urine) pale yellow.  Eat soups and other clear broths. General instructions   Take over-the-counter and prescription medicines only as told by your doctor. These include cold medicines, fever reducers, and cough suppressants.  Do not use any products that contain nicotine or tobacco. These include cigarettes and e-cigarettes. If you need help quitting, ask your doctor.  Avoid being where people are smoking (avoid secondhand smoke).  Make sure you get regular shots and get the flu shot every year.  Keep all follow-up visits as told by your doctor. This is important. How to avoid spreading infection to others   Wash your hands often with soap and water. If you do not have  soap and water, use hand sanitizer.  Avoid touching your mouth, face, eyes, or nose.  Cough or sneeze into a tissue or your sleeve or elbow. Do not cough or sneeze into your hand or into the air. Contact a doctor if:  You are getting worse, not better.  You have any of these: ? A fever. ? Chills. ? Brown or red mucus in your nose. ? Yellow or brown fluid (discharge)coming from your nose. ? Pain in your face, especially when you bend forward. ? Swollen neck glands. ? Pain with swallowing. ? White areas in the back of your throat. Get help right away if:  You have shortness of breath that gets  worse.  You have very bad or constant: ? Headache. ? Ear pain. ? Pain in your forehead, behind your eyes, and over your cheekbones (sinus pain). ? Chest pain.  You have long-lasting (chronic) lung disease along with any of these: ? Wheezing. ? Long-lasting cough. ? Coughing up blood. ? A change in your usual mucus.  You have a stiff neck.  You have changes in your: ? Vision. ? Hearing. ? Thinking. ? Mood. Summary  An upper respiratory infection (URI) is caused by a germ called a virus. The most common type of URI is often called "the common cold."  URIs usually get better within 7-10 days.  Take over-the-counter and prescription medicines only as told by your doctor. This information is not intended to replace advice given to you by your health care provider. Make sure you discuss any questions you have with your health care provider. Document Released: 05/16/2008 Document Revised: 07/21/2017 Document Reviewed: 07/21/2017 Elsevier Interactive Patient Education  2019 [employer]

## 2019-01-28 ENCOUNTER — Telehealth: Payer: Self-pay | Admitting: Emergency Medicine

## 2019-01-28 NOTE — Telephone Encounter (Signed)
Spoke with patient whom stated still has mucus unsure if medicine is helping that. But cough has subsided.

## 2019-01-29 NOTE — Telephone Encounter (Signed)
I'm glad to hear cough has improved. For mucus, recommend increasing fluids (will thin mucus), taking OTC Mucinex, and may wish to use an over the counter decongestant such as Sudafed. If runny nose or stuffy nose, recommend use over the counter Flonase nasal spray.  Thank you, SFS PA-C

## 2020-01-09 DIAGNOSIS — H5213 Myopia, bilateral: Secondary | ICD-10-CM | POA: Diagnosis not present

## 2020-03-07 ENCOUNTER — Ambulatory Visit: Payer: Self-pay | Attending: Internal Medicine

## 2020-03-07 DIAGNOSIS — Z23 Encounter for immunization: Secondary | ICD-10-CM

## 2020-03-07 NOTE — Progress Notes (Signed)
   Covid-19 Vaccination Clinic  Name:  Waldo Damian    MRN: [id number] DOB: 1971/02/06  03/07/2020  Mr. Elmore was observed post Covid-19 immunization for 15 minutes without incident. He was provided with Vaccine Information Sheet and instruction to access the V-Safe system.   Mr. Mccree was instructed to call 911 with any severe reactions post vaccine: [name] Kitchen Difficulty breathing  . Swelling of face and throat  . A fast heartbeat  . A bad rash all over body  . Dizziness and weakness   Immunizations Administered    Name Date Dose VIS Date Route   Pfizer COVID-19 Vaccine 03/07/2020  3:52 PM 0.3 mL 11/22/2019 Intramuscular   Manufacturer: [employer], [employer]   Lot: [id number]   NDC: [address]-3

## 2020-03-28 ENCOUNTER — Ambulatory Visit: Payer: Self-pay | Attending: Internal Medicine

## 2020-03-28 DIAGNOSIS — Z23 Encounter for immunization: Secondary | ICD-10-CM

## 2020-03-28 NOTE — Progress Notes (Signed)
   Covid-19 Vaccination Clinic  Name:  Darin Murphy    MRN: [id number] DOB: December 11, 1971  03/28/2020  [name] was observed post Covid-19 immunization for 15 minutes without incident. He was provided with Vaccine Information Sheet and instruction to access the V-Safe system.   [name] was instructed to call 911 with any severe reactions post vaccine: [name] Kitchen Difficulty breathing  . Swelling of face and throat  . A fast heartbeat  . A bad rash all over body  . Dizziness and weakness   Immunizations Administered    Name Date Dose VIS Date Route   Pfizer COVID-19 Vaccine 03/28/2020  3:39 PM 0.3 mL 11/22/2019 Intramuscular   Manufacturer: [employer], [employer]   Lot: [id number]   NDC: [address]-3

## 2020-11-19 DIAGNOSIS — Z0000 Encounter for general adult medical examination without abnormal findings: Secondary | ICD-10-CM | POA: Diagnosis not present

## 2020-11-19 DIAGNOSIS — Z6827 Body mass index (BMI) 27.0-27.9, adult: Secondary | ICD-10-CM | POA: Diagnosis not present

## 2020-11-19 DIAGNOSIS — M545 Low back pain, unspecified: Secondary | ICD-10-CM | POA: Diagnosis not present

## 2020-11-19 DIAGNOSIS — I10 Essential (primary) hypertension: Secondary | ICD-10-CM | POA: Diagnosis not present

## 2020-11-19 DIAGNOSIS — Z125 Encounter for screening for malignant neoplasm of prostate: Secondary | ICD-10-CM | POA: Diagnosis not present

## 2020-12-07 DIAGNOSIS — Z20822 Contact with and (suspected) exposure to covid-19: Secondary | ICD-10-CM | POA: Diagnosis not present

## 2021-10-12 ENCOUNTER — Encounter (INDEPENDENT_AMBULATORY_CARE_PROVIDER_SITE_OTHER): Payer: Self-pay | Admitting: *Deleted

## 2022-02-14 ENCOUNTER — Encounter (INDEPENDENT_AMBULATORY_CARE_PROVIDER_SITE_OTHER): Payer: Self-pay | Admitting: Gastroenterology

## 2022-02-14 ENCOUNTER — Ambulatory Visit (INDEPENDENT_AMBULATORY_CARE_PROVIDER_SITE_OTHER): Payer: Self-pay | Admitting: Gastroenterology

## 2022-02-14 ENCOUNTER — Encounter (INDEPENDENT_AMBULATORY_CARE_PROVIDER_SITE_OTHER): Payer: Self-pay | Admitting: *Deleted

## 2023-01-20 ENCOUNTER — Ambulatory Visit
Admission: EM | Admit: 2023-01-20 | Discharge: 2023-01-20 | Disposition: A | Discharge status: Home / Self Care | Payer: 59

## 2023-01-20 DIAGNOSIS — J069 Acute upper respiratory infection, unspecified: Secondary | ICD-10-CM

## 2023-01-20 HISTORY — DX: Pure hypercholesterolemia, unspecified: E78.00

## 2023-01-20 MED ORDER — GUAIFENESIN ER 600 MG PO TB12: 1200.0000 mg | ORAL_TABLET | Freq: Two times a day (BID) | ORAL | 0 refills | Status: AC | PRN

## 2023-01-20 MED ORDER — FLUTICASONE PROPIONATE 50 MCG/ACT NA SUSP: 1.0000 | Freq: Every day | NASAL | 0 refills | Status: AC

## 2023-01-20 NOTE — ED Triage Notes (Signed)
Patient to Urgent Care with complaints of nasal congestion. Describes the inside of her nose feels sore. Reports a lot of clear mucus production. Denies any known fevers.   Symptoms started two days ago. Has been taking tylenol.

## 2023-01-20 NOTE — Discharge Instructions (Signed)
Use the Flonase nasal spray and take the Mucinex as directed.  Follow up with your primary care provider if your symptoms are not improving.

## 2023-01-20 NOTE — ED Provider Notes (Signed)
Darin Murphy    CSN: [id number] Arrival date & time: 01/20/23  1156      History   Chief Complaint Chief Complaint  Patient presents with   Nasal Congestion    HPI Darin Murphy is a 52 y.o. male.  Patient presents with 2 day history of nasal congestion, postnasal drip, runny nose.  He took Tylenol yesterday but no OTC medications taken today.  No fever, rash, ear pain, sore throat, cough, shortness of breath, or other symptoms.    The history is provided by the patient and medical records.    Past Medical History:  Diagnosis Date   High cholesterol     There are no problems to display for this patient.   History reviewed. No pertinent surgical history.     Home Medications    Prior to Admission medications   Medication Sig Start Date End Date Taking? Authorizing Provider  fluticasone (FLONASE) 50 MCG/ACT nasal spray Place 1 spray into both nostrils daily. 01/20/23  Yes Sharion Balloon, NP  guaiFENesin (MUCINEX) 600 MG 12 hr tablet Take 2 tablets (1,200 mg total) by mouth 2 (two) times daily as needed. 01/20/23  Yes Sharion Balloon, NP  albuterol (PROVENTIL HFA;VENTOLIN HFA) 108 (90 Base) MCG/ACT inhaler Inhale 2 puffs into the lungs every 4 (four) hours as needed. Patient not taking: Reported on 01/20/2023 01/24/19   Darlin Priestly, PA-C  Albuterol Sulfate 108 (90 Base) MCG/ACT AEPB Inhale into the lungs. Patient not taking: Reported on 01/20/2023 09/06/18   [provider]  brompheniramine-pseudoephedrine-DM 30-2-10 MG/5ML syrup Take 5 mLs by mouth 4 (four) times daily as needed. Patient not taking: Reported on 01/20/2023 01/24/19   Darlin Priestly, PA-C  cefUROXime (CEFTIN) 250 MG tablet Take 250 mg by mouth 2 (two) times daily. Patient not taking: Reported on 01/20/2023 01/19/23   [provider]  omeprazole (PRILOSEC) 40 MG capsule Take 40 mg by mouth 2 (two) times daily. Patient not taking: Reported on 01/20/2023 12/02/22   [provider]   predniSONE (DELTASONE) 10 MG tablet Take 10 mg by mouth daily. Patient not taking: Reported on 01/20/2023 01/19/23   [provider]  rosuvastatin (CRESTOR) 5 MG tablet Take 5 mg by mouth daily. 01/15/23   [provider]    Family History History reviewed. No pertinent family history.  Social History Social History   Tobacco Use   Smoking status: Former    Years: 20.00    Types: Cigarettes   Smokeless tobacco: Never  Substance Use Topics   Alcohol use: No    Alcohol/week: 0.0 standard drinks of alcohol   Drug use: No     Allergies   Patient has no known allergies.   Review of Systems Review of Systems  Constitutional:  Negative for chills and fever.  HENT:  Positive for congestion, postnasal drip and rhinorrhea. Negative for ear pain and sore throat.   Respiratory:  Negative for cough and shortness of breath.   Cardiovascular:  Negative for chest pain and palpitations.  Gastrointestinal:  Negative for diarrhea and vomiting.  Skin:  Negative for color change and rash.  All other systems reviewed and are negative.    Physical Exam Triage Vital Signs ED Triage Vitals  Enc Vitals Group     BP      Pulse      Resp      Temp      Temp src      SpO2  Weight      Height      Head Circumference      Peak Flow      Pain Score      Pain Loc      Pain Edu?      Excl. in [place]?    No data found.  Updated Vital Signs BP 121/88   Pulse 74   Temp 97.6 F (36.4 C)   Resp 18   SpO2 96%   Visual Acuity Right Eye Distance:   Left Eye Distance:   Bilateral Distance:    Right Eye Near:   Left Eye Near:    Bilateral Near:     Physical Exam Vitals and nursing note reviewed.  Constitutional:      General: He is not in acute distress.    Appearance: Normal appearance. He is well-developed. He is not ill-appearing.  HENT:     Right Ear: Tympanic membrane normal.     Left Ear: Tympanic membrane normal.     Nose: Congestion present.      Mouth/Throat:     Mouth: Mucous membranes are moist.     Pharynx: Oropharynx is clear.  Cardiovascular:     Rate and Rhythm: Normal rate and regular rhythm.     Heart sounds: Normal heart sounds.  Pulmonary:     Effort: Pulmonary effort is normal. No respiratory distress.     Breath sounds: Normal breath sounds.  Musculoskeletal:     Cervical back: Neck supple.  Skin:    General: Skin is warm and dry.  Neurological:     Mental Status: He is alert.  Psychiatric:        Mood and Affect: Mood normal.        Behavior: Behavior normal.      UC Treatments / Results  Labs (all labs ordered are listed, but only abnormal results are displayed) Labs Reviewed - No data to display  EKG   Radiology No results found.  Procedures Procedures (including critical care time)  Medications Ordered in UC Medications - No data to display  Initial Impression / Assessment and Plan / UC Course  I have reviewed the triage vital signs and the nursing notes.  Pertinent labs & imaging results that were available during my care of the patient were reviewed by me and considered in my medical decision making (see chart for details).    Viral URI.  Patient declines COVID test.  Treating today with Flonase and Mucinex.  Instructed patient to follow up with his PCP if symptoms are not improving.  He agrees to plan of care.   Final Clinical Impressions(s) / UC Diagnoses   Final diagnoses:  Viral URI     Discharge Instructions      Use the Flonase nasal spray and take the Mucinex as directed.  Follow up with your primary care provider if your symptoms are not improving.        ED Prescriptions     Medication Sig Dispense Auth. Provider   fluticasone (FLONASE) 50 MCG/ACT nasal spray Place 1 spray into both nostrils daily. 16 g Sharion Balloon, NP   guaiFENesin (MUCINEX) 600 MG 12 hr tablet Take 2 tablets (1,200 mg total) by mouth 2 (two) times daily as needed. 28 tablet Sharion Balloon, NP       PDMP not reviewed this encounter.   Sharion Balloon, NP 01/20/23 1249

## 2023-06-14 ENCOUNTER — Emergency Department: Payer: 59

## 2023-06-14 ENCOUNTER — Emergency Department
Admission: EM | Admit: 2023-06-14 | Discharge: 2023-06-14 | Discharge status: Against Medical Advice | Payer: 59 | Attending: Emergency Medicine | Admitting: Emergency Medicine

## 2023-06-14 ENCOUNTER — Encounter: Payer: Self-pay | Admitting: Emergency Medicine

## 2023-06-14 DIAGNOSIS — Z5321 Procedure and treatment not carried out due to patient leaving prior to being seen by health care provider: Secondary | ICD-10-CM | POA: Insufficient documentation

## 2023-06-14 DIAGNOSIS — R030 Elevated blood-pressure reading, without diagnosis of hypertension: Secondary | ICD-10-CM | POA: Insufficient documentation

## 2023-06-14 DIAGNOSIS — R531 Weakness: Secondary | ICD-10-CM | POA: Diagnosis not present

## 2023-06-14 DIAGNOSIS — R519 Headache, unspecified: Secondary | ICD-10-CM | POA: Diagnosis present

## 2023-06-14 LAB — CBC
HCT: 49.2 % (ref 39.0–52.0)
Hemoglobin: 17.2 g/dL — ABNORMAL HIGH (ref 13.0–17.0)
MCH: 28.4 pg (ref 26.0–34.0)
MCHC: 35 g/dL (ref 30.0–36.0)
MCV: 81.2 fL (ref 80.0–100.0)
Platelets: 201 10*3/uL (ref 150–400)
RBC: 6.06 MIL/uL — ABNORMAL HIGH (ref 4.22–5.81)
RDW: 13.2 % (ref 11.5–15.5)
WBC: 11.1 10*3/uL — ABNORMAL HIGH (ref 4.0–10.5)
nRBC: 0 % (ref 0.0–0.2)

## 2023-06-14 LAB — BASIC METABOLIC PANEL
Anion gap: 10 (ref 5–15)
BUN: 13 mg/dL (ref 6–20)
CO2: 22 mmol/L (ref 22–32)
Calcium: 9.4 mg/dL (ref 8.9–10.3)
Chloride: 105 mmol/L (ref 98–111)
Creatinine, Ser: 1.02 mg/dL (ref 0.61–1.24)
GFR, Estimated: >60 mL/min (ref >60)
Glucose, Bld: 95 mg/dL (ref 70–99)
Potassium: 3.6 mmol/L (ref 3.5–5.1)
Sodium: 137 mmol/L (ref 135–145)

## 2023-06-14 LAB — TROPONIN I (HIGH SENSITIVITY): Troponin I (High Sensitivity): 8 ng/L (ref <18)

## 2023-06-14 NOTE — ED Triage Notes (Signed)
Pt c/o HA, weakness, left sided chest pain x1 day. Pt reports high blood pressure readings all day as well. Pt denies hypertension as well as other cardiac hx.

## 2024-06-06 ENCOUNTER — Other Ambulatory Visit (HOSPITAL_COMMUNITY): Payer: Self-pay | Admitting: Internal Medicine

## 2024-06-06 DIAGNOSIS — Z136 Encounter for screening for cardiovascular disorders: Secondary | ICD-10-CM

## 2024-06-10 ENCOUNTER — Encounter (INDEPENDENT_AMBULATORY_CARE_PROVIDER_SITE_OTHER): Payer: Self-pay | Admitting: *Deleted

## 2024-06-13 ENCOUNTER — Other Ambulatory Visit (HOSPITAL_COMMUNITY): Payer: Self-pay | Admitting: Internal Medicine

## 2024-06-13 ENCOUNTER — Ambulatory Visit
Admission: RE | Admit: 2024-06-13 | Discharge: 2024-06-13 | Disposition: A | Discharge status: Home / Self Care | Payer: Self-pay | Source: Ambulatory Visit | Attending: Internal Medicine | Admitting: Internal Medicine

## 2024-06-13 DIAGNOSIS — Z136 Encounter for screening for cardiovascular disorders: Secondary | ICD-10-CM | POA: Insufficient documentation

## 2024-12-10 ENCOUNTER — Encounter (INDEPENDENT_AMBULATORY_CARE_PROVIDER_SITE_OTHER): Payer: Self-pay | Admitting: *Deleted
